# Patient Record
Sex: Male | Born: 1980 | Race: White | Hispanic: No | Marital: Single | Smoking: Never smoker
Health system: Southern US, Community
[De-identification: ages and names within clinical notes are randomized; demographics above are authoritative.]

## PROBLEM LIST (undated history)

## (undated) DIAGNOSIS — G4730 Sleep apnea, unspecified: Secondary | ICD-10-CM

## (undated) DIAGNOSIS — I370 Nonrheumatic pulmonary valve stenosis: Secondary | ICD-10-CM

## (undated) DIAGNOSIS — K469 Unspecified abdominal hernia without obstruction or gangrene: Secondary | ICD-10-CM

## (undated) DIAGNOSIS — N183 Chronic kidney disease, stage 3 unspecified: Secondary | ICD-10-CM

## (undated) DIAGNOSIS — J189 Pneumonia, unspecified organism: Secondary | ICD-10-CM

## (undated) DIAGNOSIS — I5032 Chronic diastolic (congestive) heart failure: Secondary | ICD-10-CM

## (undated) DIAGNOSIS — Q909 Down syndrome, unspecified: Secondary | ICD-10-CM

## (undated) DIAGNOSIS — Z9889 Other specified postprocedural states: Secondary | ICD-10-CM

## (undated) HISTORY — PX: CHEST TUBE INSERTION: SHX231

## (undated) HISTORY — DX: Chronic kidney disease, stage 3 (moderate): N18.3

## (undated) HISTORY — DX: Chronic kidney disease, stage 3 unspecified: N18.30

## (undated) HISTORY — DX: Morbid (severe) obesity due to excess calories: E66.01

## (undated) HISTORY — DX: Chronic diastolic (congestive) heart failure: I50.32

---
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
## 2009-11-15 ENCOUNTER — Emergency Department (HOSPITAL_COMMUNITY): Admission: EM | Admit: 2009-11-15 | Discharge: 2009-11-15 | Payer: Self-pay | Admitting: Emergency Medicine

## 2016-09-21 DIAGNOSIS — R55 Syncope and collapse: Secondary | ICD-10-CM | POA: Diagnosis not present

## 2016-09-21 DIAGNOSIS — J9601 Acute respiratory failure with hypoxia: Secondary | ICD-10-CM

## 2016-09-21 DIAGNOSIS — F79 Unspecified intellectual disabilities: Secondary | ICD-10-CM | POA: Diagnosis not present

## 2016-09-21 DIAGNOSIS — J189 Pneumonia, unspecified organism: Secondary | ICD-10-CM

## 2016-09-22 DIAGNOSIS — J189 Pneumonia, unspecified organism: Secondary | ICD-10-CM | POA: Diagnosis not present

## 2016-09-22 DIAGNOSIS — J9601 Acute respiratory failure with hypoxia: Secondary | ICD-10-CM | POA: Diagnosis not present

## 2016-09-22 DIAGNOSIS — F79 Unspecified intellectual disabilities: Secondary | ICD-10-CM | POA: Diagnosis not present

## 2016-09-22 DIAGNOSIS — R55 Syncope and collapse: Secondary | ICD-10-CM | POA: Diagnosis not present

## 2016-09-23 DIAGNOSIS — R55 Syncope and collapse: Secondary | ICD-10-CM | POA: Diagnosis not present

## 2016-09-23 DIAGNOSIS — J189 Pneumonia, unspecified organism: Secondary | ICD-10-CM | POA: Diagnosis not present

## 2016-09-23 DIAGNOSIS — F79 Unspecified intellectual disabilities: Secondary | ICD-10-CM | POA: Diagnosis not present

## 2016-09-23 DIAGNOSIS — J9601 Acute respiratory failure with hypoxia: Secondary | ICD-10-CM | POA: Diagnosis not present

## 2016-09-24 DIAGNOSIS — J9601 Acute respiratory failure with hypoxia: Secondary | ICD-10-CM | POA: Diagnosis not present

## 2016-09-24 DIAGNOSIS — J189 Pneumonia, unspecified organism: Secondary | ICD-10-CM | POA: Diagnosis not present

## 2016-09-24 DIAGNOSIS — R55 Syncope and collapse: Secondary | ICD-10-CM | POA: Diagnosis not present

## 2016-09-24 DIAGNOSIS — F79 Unspecified intellectual disabilities: Secondary | ICD-10-CM | POA: Diagnosis not present

## 2016-09-25 DIAGNOSIS — J9601 Acute respiratory failure with hypoxia: Secondary | ICD-10-CM | POA: Diagnosis not present

## 2016-09-25 DIAGNOSIS — R55 Syncope and collapse: Secondary | ICD-10-CM | POA: Diagnosis not present

## 2016-09-25 DIAGNOSIS — F79 Unspecified intellectual disabilities: Secondary | ICD-10-CM | POA: Diagnosis not present

## 2016-09-25 DIAGNOSIS — J189 Pneumonia, unspecified organism: Secondary | ICD-10-CM | POA: Diagnosis not present

## 2016-09-25 DIAGNOSIS — R7881 Bacteremia: Secondary | ICD-10-CM | POA: Diagnosis not present

## 2018-01-28 ENCOUNTER — Inpatient Hospital Stay (HOSPITAL_COMMUNITY)
Admission: AD | Admit: 2018-01-28 | Discharge: 2018-02-07 | DRG: 177 | Disposition: A | Discharge status: Home / Self Care | Payer: Medicare Other | Source: Other Acute Inpatient Hospital | Attending: Internal Medicine | Admitting: Internal Medicine

## 2018-01-28 ENCOUNTER — Other Ambulatory Visit: Payer: Self-pay

## 2018-01-28 ENCOUNTER — Encounter (HOSPITAL_COMMUNITY): Payer: Self-pay | Admitting: *Deleted

## 2018-01-28 DIAGNOSIS — J189 Pneumonia, unspecified organism: Secondary | ICD-10-CM | POA: Diagnosis present

## 2018-01-28 DIAGNOSIS — Z4682 Encounter for fitting and adjustment of non-vascular catheter: Secondary | ICD-10-CM

## 2018-01-28 DIAGNOSIS — D631 Anemia in chronic kidney disease: Secondary | ICD-10-CM | POA: Diagnosis present

## 2018-01-28 DIAGNOSIS — G4733 Obstructive sleep apnea (adult) (pediatric): Secondary | ICD-10-CM | POA: Diagnosis present

## 2018-01-28 DIAGNOSIS — I5030 Unspecified diastolic (congestive) heart failure: Secondary | ICD-10-CM | POA: Diagnosis not present

## 2018-01-28 DIAGNOSIS — Z9889 Other specified postprocedural states: Secondary | ICD-10-CM

## 2018-01-28 DIAGNOSIS — L03115 Cellulitis of right lower limb: Secondary | ICD-10-CM | POA: Diagnosis present

## 2018-01-28 DIAGNOSIS — Q909 Down syndrome, unspecified: Secondary | ICD-10-CM

## 2018-01-28 DIAGNOSIS — R0600 Dyspnea, unspecified: Secondary | ICD-10-CM

## 2018-01-28 DIAGNOSIS — N183 Chronic kidney disease, stage 3 unspecified: Secondary | ICD-10-CM

## 2018-01-28 DIAGNOSIS — Z6841 Body Mass Index (BMI) 40.0 and over, adult: Secondary | ICD-10-CM

## 2018-01-28 DIAGNOSIS — E8809 Other disorders of plasma-protein metabolism, not elsewhere classified: Secondary | ICD-10-CM | POA: Diagnosis not present

## 2018-01-28 DIAGNOSIS — J869 Pyothorax without fistula: Secondary | ICD-10-CM | POA: Diagnosis present

## 2018-01-28 DIAGNOSIS — J90 Pleural effusion, not elsewhere classified: Secondary | ICD-10-CM

## 2018-01-28 DIAGNOSIS — Z8709 Personal history of other diseases of the respiratory system: Secondary | ICD-10-CM

## 2018-01-28 DIAGNOSIS — I5033 Acute on chronic diastolic (congestive) heart failure: Secondary | ICD-10-CM | POA: Diagnosis present

## 2018-01-28 DIAGNOSIS — I5031 Acute diastolic (congestive) heart failure: Secondary | ICD-10-CM | POA: Diagnosis not present

## 2018-01-28 DIAGNOSIS — L03116 Cellulitis of left lower limb: Secondary | ICD-10-CM | POA: Diagnosis present

## 2018-01-28 DIAGNOSIS — R600 Localized edema: Secondary | ICD-10-CM | POA: Diagnosis not present

## 2018-01-28 DIAGNOSIS — I509 Heart failure, unspecified: Secondary | ICD-10-CM

## 2018-01-28 DIAGNOSIS — I370 Nonrheumatic pulmonary valve stenosis: Secondary | ICD-10-CM

## 2018-01-28 DIAGNOSIS — Z9689 Presence of other specified functional implants: Secondary | ICD-10-CM

## 2018-01-28 DIAGNOSIS — E876 Hypokalemia: Secondary | ICD-10-CM | POA: Diagnosis not present

## 2018-01-28 DIAGNOSIS — Z23 Encounter for immunization: Secondary | ICD-10-CM | POA: Diagnosis present

## 2018-01-28 DIAGNOSIS — R0602 Shortness of breath: Secondary | ICD-10-CM

## 2018-01-28 DIAGNOSIS — I5023 Acute on chronic systolic (congestive) heart failure: Secondary | ICD-10-CM | POA: Diagnosis not present

## 2018-01-28 DIAGNOSIS — R0609 Other forms of dyspnea: Secondary | ICD-10-CM | POA: Diagnosis not present

## 2018-01-28 DIAGNOSIS — D649 Anemia, unspecified: Secondary | ICD-10-CM | POA: Diagnosis not present

## 2018-01-28 HISTORY — DX: Sleep apnea, unspecified: G47.30

## 2018-01-28 HISTORY — DX: Other specified postprocedural states: Z98.890

## 2018-01-28 HISTORY — DX: Nonrheumatic pulmonary valve stenosis: I37.0

## 2018-01-28 HISTORY — DX: Down syndrome, unspecified: Q90.9

## 2018-01-28 HISTORY — DX: Pneumonia, unspecified organism: J18.9

## 2018-01-28 HISTORY — DX: Unspecified abdominal hernia without obstruction or gangrene: K46.9

## 2018-01-28 MED ORDER — INFLUENZA VAC SPLIT QUAD 0.5 ML IM SUSY
0.5000 mL | PREFILLED_SYRINGE | INTRAMUSCULAR | Status: AC
  Administered 2018-01-29: 0.5 mL via INTRAMUSCULAR

## 2018-01-29 ENCOUNTER — Encounter (HOSPITAL_COMMUNITY): Payer: Self-pay | Admitting: Cardiology

## 2018-01-29 ENCOUNTER — Other Ambulatory Visit (HOSPITAL_COMMUNITY): Payer: Medicare Other

## 2018-01-29 ENCOUNTER — Inpatient Hospital Stay (HOSPITAL_COMMUNITY): Payer: Medicare Other

## 2018-01-29 DIAGNOSIS — J869 Pyothorax without fistula: Principal | ICD-10-CM

## 2018-01-29 DIAGNOSIS — Z8709 Personal history of other diseases of the respiratory system: Secondary | ICD-10-CM

## 2018-01-29 DIAGNOSIS — Q909 Down syndrome, unspecified: Secondary | ICD-10-CM

## 2018-01-29 DIAGNOSIS — D649 Anemia, unspecified: Secondary | ICD-10-CM

## 2018-01-29 DIAGNOSIS — I5023 Acute on chronic systolic (congestive) heart failure: Secondary | ICD-10-CM

## 2018-01-29 DIAGNOSIS — L03116 Cellulitis of left lower limb: Secondary | ICD-10-CM

## 2018-01-29 DIAGNOSIS — R600 Localized edema: Secondary | ICD-10-CM

## 2018-01-29 DIAGNOSIS — I509 Heart failure, unspecified: Secondary | ICD-10-CM

## 2018-01-29 DIAGNOSIS — G4733 Obstructive sleep apnea (adult) (pediatric): Secondary | ICD-10-CM | POA: Diagnosis present

## 2018-01-29 LAB — COMPREHENSIVE METABOLIC PANEL
ALK PHOS: 135 U/L — AB (ref 38–126)
ALT: 12 U/L (ref 0–44)
ANION GAP: 11 (ref 5–15)
AST: 18 U/L (ref 15–41)
Albumin: 2.3 g/dL — ABNORMAL LOW (ref 3.5–5.0)
BUN: 12 mg/dL (ref 6–20)
CALCIUM: 7.9 mg/dL — AB (ref 8.9–10.3)
CO2: 32 mmol/L (ref 22–32)
Chloride: 96 mmol/L — ABNORMAL LOW (ref 98–111)
Creatinine, Ser: 1.13 mg/dL (ref 0.61–1.24)
GFR calc non Af Amer: >60 mL/min (ref >60)
Glucose, Bld: 146 mg/dL — ABNORMAL HIGH (ref 70–99)
POTASSIUM: 3.7 mmol/L (ref 3.5–5.1)
SODIUM: 139 mmol/L (ref 135–145)
TOTAL PROTEIN: 6.4 g/dL — AB (ref 6.5–8.1)
Total Bilirubin: 0.5 mg/dL (ref 0.3–1.2)

## 2018-01-29 LAB — URINALYSIS, ROUTINE W REFLEX MICROSCOPIC
Bilirubin Urine: NEGATIVE
Glucose, UA: NEGATIVE mg/dL
Hgb urine dipstick: NEGATIVE
Ketones, ur: NEGATIVE mg/dL
Leukocytes, UA: NEGATIVE
Nitrite: NEGATIVE
Protein, ur: NEGATIVE mg/dL
Specific Gravity, Urine: 1.02 (ref 1.005–1.030)
pH: 7 (ref 5.0–8.0)

## 2018-01-29 LAB — CBC
HEMATOCRIT: 36.2 % — AB (ref 39.0–52.0)
HEMOGLOBIN: 11 g/dL — AB (ref 13.0–17.0)
MCH: 29.3 pg (ref 26.0–34.0)
MCHC: 30.4 g/dL (ref 30.0–36.0)
MCV: 96.3 fL (ref 78.0–100.0)
Platelets: 369 10*3/uL (ref 150–400)
RBC: 3.76 MIL/uL — AB (ref 4.22–5.81)
RDW: 16.2 % — ABNORMAL HIGH (ref 11.5–15.5)
WBC: 6.2 10*3/uL (ref 4.0–10.5)

## 2018-01-29 LAB — BRAIN NATRIURETIC PEPTIDE: B NATRIURETIC PEPTIDE 5: 10.9 pg/mL (ref 0.0–100.0)

## 2018-01-29 LAB — SURGICAL PCR SCREEN
MRSA, PCR: NEGATIVE
Staphylococcus aureus: NEGATIVE

## 2018-01-29 LAB — TROPONIN I: Troponin I: <0.03 ng/mL (ref <0.03)

## 2018-01-29 LAB — HIV ANTIBODY (ROUTINE TESTING W REFLEX): HIV SCREEN 4TH GENERATION: NONREACTIVE

## 2018-01-29 LAB — MRSA PCR SCREENING: MRSA by PCR: POSITIVE — AB

## 2018-01-29 MED ORDER — SODIUM CHLORIDE 0.9 % IV SOLN
INTRAVENOUS | Status: DC | PRN
  Administered 2018-01-29: 10:00:00 via INTRAVENOUS

## 2018-01-29 MED ORDER — FUROSEMIDE 10 MG/ML IJ SOLN
40.0000 mg | Freq: Two times a day (BID) | INTRAMUSCULAR | Status: DC
  Administered 2018-01-29 – 2018-01-30 (×3): 40 mg via INTRAVENOUS
  Filled 2018-01-29 (×3): qty 4

## 2018-01-29 MED ORDER — CHLORHEXIDINE GLUCONATE CLOTH 2 % EX PADS
6.0000 | MEDICATED_PAD | Freq: Every day | CUTANEOUS | Status: AC
  Administered 2018-01-29 – 2018-02-02 (×4): 6 via TOPICAL

## 2018-01-29 MED ORDER — ACETAMINOPHEN 650 MG RE SUPP: 650.0000 mg | Freq: Four times a day (QID) | RECTAL | Status: DC | PRN

## 2018-01-29 MED ORDER — PIPERACILLIN-TAZOBACTAM 3.375 G IVPB
3.3750 g | Freq: Three times a day (TID) | INTRAVENOUS | Status: DC
  Administered 2018-01-29 – 2018-02-07 (×27): 3.375 g via INTRAVENOUS
  Filled 2018-01-29 (×29): qty 50

## 2018-01-29 MED ORDER — PIPERACILLIN-TAZOBACTAM 3.375 G IVPB 30 MIN
3.3750 g | Freq: Once | INTRAVENOUS | Status: AC
  Administered 2018-01-29: 3.375 g via INTRAVENOUS
  Filled 2018-01-29 (×2): qty 50

## 2018-01-29 MED ORDER — ONDANSETRON HCL 4 MG/2ML IJ SOLN: 4.0000 mg | Freq: Four times a day (QID) | INTRAMUSCULAR | Status: DC | PRN

## 2018-01-29 MED ORDER — ACETAMINOPHEN 325 MG PO TABS: 650.0000 mg | ORAL_TABLET | Freq: Four times a day (QID) | ORAL | Status: DC | PRN

## 2018-01-29 MED ORDER — MUPIROCIN 2 % EX OINT
1.0000 "application " | TOPICAL_OINTMENT | Freq: Two times a day (BID) | CUTANEOUS | Status: AC
  Administered 2018-01-29 – 2018-02-02 (×10): 1 via NASAL
  Filled 2018-01-29 (×2): qty 22

## 2018-01-29 MED ORDER — ONDANSETRON HCL 4 MG PO TABS: 4.0000 mg | ORAL_TABLET | Freq: Four times a day (QID) | ORAL | Status: DC | PRN

## 2018-01-29 NOTE — Progress Notes (Signed)
MD called, patient can have heart healthy diet.

## 2018-01-29 NOTE — Progress Notes (Signed)
Pharmacy Antibiotic Note  Joshua Reilly is a 37 y.o. male admitted on 01/28/2018 with empyema.  Pharmacy has been consulted for Zosyn dosing.  Plan: Zosyn 3.375g IV q8h (4-hour infusion).  Height: 5\' 6"  (167.6 cm) Weight: (!) 361 lb 6.4 oz (163.9 kg) IBW/kg (Calculated) : 63.8  Temp (24hrs), Avg:98.7 F (37.1 C), Min:98.5 F (36.9 C), Max:98.9 F (37.2 C)   No Known Allergies   Thank you for allowing pharmacy to be a part of this patient's care.  Vernard Gambles, PharmD, BCPS  01/29/2018 2:17 AM

## 2018-01-29 NOTE — Progress Notes (Signed)
Informed MD of MRSA PCR positive and primary staff aware

## 2018-01-29 NOTE — Progress Notes (Signed)
PROGRESS NOTE   Olav Zawada  ZZC:[id number]    DOB: 1980/11/03    DOA: 01/28/2018  PCP: Shelbie Ammons, MD   I have briefly reviewed patients previous medical records in [facility].  Brief Narrative:  37 year old male, resident of an ALF, PMH of Down syndrome, pulmonary stenosis, chronic CHF, OSA awaiting CPAP, not on home oxygen, abdominal hernia, as per history provided by father, patient was hospitalized sometime in 2018 and had 2 chest tubes placed on the right side which drained thick fluid, underwent left-sided thoracentesis in June at [facility], now transferred from [facility] on 9/10 with a couple of weeks history of progressive lower extremity edema and dyspnea on mild exertion.  He has had low-grade fevers but no cough.  Evaluation at [facility] showed CTA negative for PE, loculated left pleural effusion concerning for empyema.  As per family, lower extremity Dopplers a week ago at ALF reportedly negative for DVT.  Lasix dose recently increased as outpatient.  Patient sees pulmonologist Dr. Blenda Nicely who evaluated him at [facility] ED and recommended admission to a tertiary cancer center for evaluation by CTS surgery, consideration for VATS.  Patient was thereby transferred to [facility].   Assessment & Plan:   Principal Problem:   Empyema (HCC) Active Problems:   CHF (congestive heart failure) (HCC)   OSA (obstructive sleep apnea)   Loculated left pleural effusion: Concern for empyema.  Patient has prior history of what appears to be right sided empyema and recurrent left-sided pleural effusions requiring intervention.  I consulted CTS who advised that patient is going to have IR place pigtail catheter or possible surgery on 02/01/2018.  Continue empirically started IV vancomycin.  Acute on chronic CHF/pulmonary stenosis: Unclear type.  No echo in CHL.  Check TTE.  Requested medical records from [facility] and ALF.  Cardiology consulted.  Currently on IV Lasix 40  mg twice daily.  -1.6 L since admission.  Anasarca: May be multifactorial related to decompensated CHF, hypoalbuminemia and possible right heart failure.  Diuresis as above.  Dietitian consultation.  OSA: Apparently recently diagnosed and awaiting CPAP.  Follows with pulmonology in [place].  Morbid obesity/Body mass index is 41.87 kg/m.  Normocytic anemia: Possibly chronic.  Follow CBCs.  Down syndrome: Mental status at baseline.   DVT prophylaxis: SCDs Code Status: Full Family Communication: Discussed in detail with patient's father at bedside, updated care and answered questions.   Disposition: To be determined pending improvement.   Consultants:  Cardiothoracic surgery Cardiology  Procedures:  None  Antimicrobials:  IV Zosyn   Subjective: Patient interviewed and examined with father at bedside.  Patient denies complaints.  Denies dyspnea, chest pain.  Wants to eat (was NPO) both procedures.  As per father, more than 2 weeks history of progressively worsening lower extremity swelling, dyspnea on mild exertion but no significant cough or fevers.  No chest pain reported.  ROS: As above, otherwise negative.  Objective:  Vitals:   01/29/18 0041 01/29/18 0635 01/29/18 0800 01/29/18 1233  BP: (!) 118/50 (!) 108/58 (!) 95/48 (!) 110/51  Pulse: 90 89 88 85  Resp: 18 18 18    Temp: 98.9 F (37.2 C) 98.7 F (37.1 C) 98.5 F (36.9 C) 99 F (37.2 C)  TempSrc: Oral Oral Oral Oral  SpO2: 97% 93% 91% 91%  Weight:  117.7 kg    Height:        Examination:  General exam: Pleasant young male, moderately built and morbidly obese, lying comfortably propped  up in bed.  Down's facies Respiratory system: Reduced breath sounds in the bases, left> right with occasional basal crackles.  Rest of lung fields clear to auscultation without wheezing, rhonchi. Respiratory effort normal. Cardiovascular system: S1 & S2 heard, RRR. No JVD, rubs, gallops or clicks.?  2 x 6 systolic ejection  murmur best heard at left lower sternal edge.  1+ pitting bilateral leg edema,?  Lymphedema. Gastrointestinal system: Abdomen is nondistended, soft and nontender. No organomegaly or masses felt. Normal bowel sounds heard.  Anterior abdominal wall hernia, uncomplicated. Central nervous system: Alert and oriented x2. No focal neurological deficits. Extremities: Symmetric 5 x 5 power. Skin: No rashes, lesions or ulcers Psychiatry: Judgement and insight impaired. Mood & affect appropriate.     Data Reviewed: I have personally reviewed following labs and imaging studies  CBC: Recent Labs  Lab 01/29/18 0814  WBC 6.2  HGB 11.0*  HCT 36.2*  MCV 96.3  PLT 369   Basic Metabolic Panel: Recent Labs  Lab 01/29/18 0814  NA 139  K 3.7  CL 96*  CO2 32  GLUCOSE 146*  BUN 12  CREATININE 1.13  CALCIUM 7.9*   Liver Function Tests: Recent Labs  Lab 01/29/18 0814  AST 18  ALT 12  ALKPHOS 135*  BILITOT 0.5  PROT 6.4*  ALBUMIN 2.3*   Cardiac Enzymes: Recent Labs  Lab 01/29/18 0814 01/29/18 1029  TROPONINI <0.03 <0.03     Recent Results (from the past 240 hour(s))  MRSA PCR Screening     Status: Abnormal   Collection Time: 01/29/18  2:24 AM  Result Value Ref Range Status   MRSA by PCR POSITIVE (A) NEGATIVE Final    Comment:        The GeneXpert MRSA Assay (FDA approved for NASAL specimens only), is one component of a comprehensive MRSA colonization surveillance program. It is not intended to diagnose MRSA infection nor to guide or monitor treatment for MRSA infections. RESULT CALLED TO, READ BACK BY AND VERIFIED WITHLeta Baptist RN [phone] 01/29/18 A BROWNING Performed at [facility], [address], [place], [place] [address]          Radiology Studies: Dg Chest 2 View  Result Date: 01/29/2018 CLINICAL DATA:  Shortness of breath EXAM: CHEST - 2 VIEW COMPARISON:  CT chest of 01/28/2018 and chest x-ray of the same day FINDINGS: There is persistent opacity at  the left lung base which compared to the CT appears to correlate with atelectasis and possible pneumonia as well as left pleural effusion. Elevation of left hemidiaphragm again is noted. The right lung is relatively clear. Mild cardiomegaly is stable. No bony abnormality is seen. IMPRESSION: Persistent opacity at the left lung base consistent with left pleural effusion, as well as left basilar atelectasis and possible pneumonia. Electronically Signed   By: Dwyane Dee M.D.   On: 01/29/2018 09:21        Scheduled Meds: . Chlorhexidine Gluconate Cloth  6 each Topical Q0600  . furosemide  40 mg Intravenous BID  . mupirocin ointment  1 application Nasal BID   Continuous Infusions: . sodium chloride 10 mL/hr at 01/29/18 1007  . piperacillin-tazobactam (ZOSYN)  IV 3.375 g (01/29/18 1017)     LOS: 1 day     Marcellus Scott, MD, FACP, [facility]. [facility] Pager [phone]  If 7PM-7AM, please contact night-coverage [email or web address] Password [email or web address] 01/29/2018, 3:11 PM

## 2018-01-29 NOTE — Progress Notes (Signed)
[facility] admissions paged for admission orders.

## 2018-01-29 NOTE — Plan of Care (Signed)
  Problem: Education: Goal: Knowledge of General Education information will improve Description Including pain rating scale, medication(s)/side effects and non-pharmacologic comfort measures Outcome: Progressing   Problem: Health Behavior/Discharge Planning: Goal: Ability to manage health-related needs will improve Outcome: Progressing   

## 2018-01-29 NOTE — Clinical Social Work Note (Signed)
Clinical Social Work Assessment  Patient Details  Name: Joshua Reilly MRN: [id number] Date of Birth: 01-26-1981  Date of referral:  01/29/18               Reason for consult:  Discharge Planning                Permission sought to share information with:  Facility Sport and exercise psychologist, Family Supports Permission granted to share information::  Yes, Verbal Permission Granted  Name::     Josten Warmuth  Agency::  [facility]  Relationship::  Father  Contact Information:  [phone]  Housing/Transportation Living arrangements for the past 2 months:  [place] of Information:  Patient, Medical Team, Partner Patient Interpreter Needed:  None Criminal Activity/Legal Involvement Pertinent to Current Situation/Hospitalization:  No - Comment as needed Significant Relationships:  Parents, Other Family Members Lives with:  Facility Resident Do you feel safe going back to the place where you live?  Yes Need for family participation in patient care:  Yes (Comment)  Care giving concerns:  Patient is a resident at [facility]F.   Social Worker assessment / plan:  CSW met with patient. Father at bedside. CSW introduced role and explained that discharge planning would be discussed. Patient and his father confirmed he is from [place] and plan to return at discharge. Patient's father will likely transport him back to the facility. No further concerns. CSW encouraged patient and his father to contact CSW as needed. CSW will continue to follow patient and his father for support and facilitate discharge back to ALF once medically stable.  Employment status:  Unemployed Forensic scientist:  Medicare PT Recommendations:  Not assessed at this time Information / Referral to community resources:  Other (Comment Required)(Plan is to return to ALF)  Patient/Family's Response to care:  Patient and his father agreeable to return to ALF. Patient's father  supportive and involved in patient's care. Patient and his father appreciated social work intervention.  Patient/Family's Understanding of and Emotional Response to Diagnosis, Current Treatment, and Prognosis:  Patient and his father have a good understanding of the reason for admission and plan to return to ALF at discharge. Patient and his father appear happy with hospital care.  Emotional Assessment Appearance:  Appears stated age Attitude/Demeanor/Rapport:  Engaged Affect (typically observed):  Accepting, Appropriate, Calm, Pleasant Orientation:  Oriented to Self, Oriented to Place, Oriented to  Time, Oriented to Situation Alcohol / Substance use:  Never Used Psych involvement (Current and /or in the community):  No (Comment)  Discharge Needs  Concerns to be addressed:  Care Coordination Readmission within the last 30 days:  No Current discharge risk:  None Barriers to Discharge:  Continued Medical Work up   Candie Chroman, LCSW 01/29/2018, 12:29 PM

## 2018-01-29 NOTE — Consult Note (Addendum)
Cardiology Consultation:   Patient ID: Joshua Reilly MRN: [id number]; DOB: 04/17/1981  Admit date: 01/28/2018 Date of Consult: 01/29/2018  Primary Care Provider: Shelbie Ammons, MD Primary Cardiologist: Tobias Alexander, MD NEW Primary Electrophysiologist:  None    Patient Profile:   Joshua Reilly is a 37 y.o. male with a hx of Down syndrome, OSA, morbid obesity and admitted early AM today for SOB, who is being seen today for the evaluation of acute on chronic CHF and pulmonary stenosis at the request of Dr. Waymon Amato.  History of Present Illness:   Joshua Reilly with above hx. And now admitted from SNF for increasing SOB over last 2 weeks.  Also with bilateral lower ext edema Lt > rt , temp of 99.7, + cough, no chest pain and venous doppler last week neg for DVT.  Lasix had been increased for last week.    Pt had pl effusions in June and was seen at [facility] with thoracentesis done twice.  ( 2 yrs ago he was in ICU and hd 2 chest tubes) Recent new OSA dx.  He has not seen cardiology in several years due to his cardiologist retired from [place].    Pt was originally seen at [facility] with CXR mild vascular congestion, CTA chest neg for PE, and increase in size of mod lt pl effusion.  Now with loculated subpulmonic components with thick ring enhancing was suspicious for empyema.    Pt transferred to [facility].   CXR today :  Persistent opacity at the left lung base consistent with left pleural effusion, as well as left basilar atelectasis and possible pneumonia.  Pt has been seen by Dr. Maren Beach for possible VATS, (pt seen by Dr Blenda Nicely pulmonologist at [facility] and recommended transfer)    Echo pending  EKG:  The EKG was personally reviewed and demonstrates:  SR with QTC 490 Telemetry:  Telemetry was personally reviewed and demonstrates:  SR  Troponin <0.03 X 2, BNP 10.9  Na 139, K+ 3.7, Ca+ 7.9, Cr 1.13 albumin 2.3,  Hgb 11, plts 369   Currently resting in bed with no chest pain or SOB.  He has not  had chest pain.  His dad tells me if he were to walk form bed to door he would have to stop and catch his breath.   No syncope, no palpitations.  Continued edema of his legs despite lasix, his dad is worried about this because not responding.    Past Medical History:  Diagnosis Date  . Abdominal hernia   . Abdominal hernia    "not repaired yet" (01/28/2018)  . CHF (congestive heart failure) (HCC)   . Down's syndrome   . Heart murmur    "born w/it" (01/28/2018)  . Pneumonia    "4-5 times" (01/28/2018)  . Pulmonary stenosis   . S/P thoracentesis 10/2017 X 2  . Sleep apnea    "waiting on machine" (01/28/2018)    Past Surgical History:  Procedure Laterality Date  . CHEST TUBE INSERTION Right ~ 2016  . CLUB FOOT RELEASE Bilateral 1980   "heel cords tightened"     Home Medications:  Prior to Admission medications   Medication Sig Start Date End Date Taking? Authorizing Provider  amLODipine (NORVASC) 5 MG tablet Take 10 mg by mouth daily. 01/14/18  Yes [provider]  aspirin 325 MG tablet Take 325 mg by mouth daily.   Yes [provider]  cephALEXin (KEFLEX) 500 MG capsule Take 500 mg by mouth 3 (three) times daily.  01/18/18  Yes [provider]  furosemide (LASIX) 40 MG tablet Take 40 mg by mouth daily. 01/23/18  Yes [provider]  lamoTRIgine (LAMICTAL) 100 MG tablet Take 100 mg by mouth at bedtime. 01/14/18  Yes [provider]  levothyroxine (SYNTHROID, LEVOTHROID) 75 MCG tablet Take 75 mcg by mouth daily. 01/14/18  Yes [provider]  LORazepam (ATIVAN) 0.5 MG tablet Take 0.5 mg by mouth every 8 (eight) hours as needed for anxiety.    Yes [provider]  Omega-3 Fatty Acids (FISH OIL) 1000 MG CAPS Take 1,000 mg by mouth daily.   Yes [provider]  potassium chloride (K-DUR,KLOR-CON) 10 MEQ tablet Take 10 mEq by mouth daily. 01/14/18  Yes [provider]  rivastigmine (EXELON) 4.5 MG capsule Take 4.5 mg by  mouth at bedtime. 01/14/18  Yes [provider]  venlafaxine XR (EFFEXOR-XR) 75 MG 24 hr capsule Take 75 mg by mouth daily. 01/14/18  Yes [provider]    Inpatient Medications: Scheduled Meds: . Chlorhexidine Gluconate Cloth  6 each Topical Q0600  . furosemide  40 mg Intravenous BID  . mupirocin ointment  1 application Nasal BID   Continuous Infusions: . sodium chloride Stopped (01/29/18 1612)  . piperacillin-tazobactam (ZOSYN)  IV Stopped (01/29/18 1604)   PRN Meds: sodium chloride, acetaminophen **OR** acetaminophen, ondansetron **OR** ondansetron (ZOFRAN) IV  Allergies:   No Known Allergies  Social History:   Social History   Socioeconomic History  . Marital status: Single    Spouse name: Not on file  . Number of children: Not on file  . Years of education: Not on file  . Highest education level: Not on file  Occupational History  . Not on file  Social Needs  . Financial resource strain: Not on file  . Food insecurity:    Worry: Not on file    Inability: Not on file  . Transportation needs:    Medical: Not on file    Non-medical: Not on file  Tobacco Use  . Smoking status: Never Smoker  . Smokeless tobacco: Never Used  Substance and Sexual Activity  . Alcohol use: Never    Frequency: Never  . Drug use: Never  . Sexual activity: Never  Lifestyle  . Physical activity:    Days per week: Not on file    Minutes per session: Not on file  . Stress: Not on file  Relationships  . Social connections:    Talks on phone: Not on file    Gets together: Not on file    Attends religious service: Not on file    Active member of club or organization: Not on file    Attends meetings of clubs or organizations: Not on file    Relationship status: Not on file  . Intimate partner violence:    Fear of current or ex partner: Not on file    Emotionally abused: Not on file    Physically abused: Not on file    Forced sexual activity: Not on file  Other Topics  Concern  . Not on file  Social History Narrative  . Not on file    Family History:    Family History  Adopted: Yes     ROS:  Please see the history of present illness.  General:no colds or fevers, no weight changes Skin:no rashes or ulcers HEENT:no blurred vision, no congestion CV:see HPI PUL:see HPI GI:no diarrhea constipation or melena, no indigestion GU:no hematuria, no dysuria MS:no joint  pain, no claudication Neuro:no syncope, no lightheadedness, Down's syndrome Endo:no diabetes, no thyroid disease  All other ROS reviewed and negative.     Physical Exam/Data:   Vitals:   01/29/18 0635 01/29/18 0800 01/29/18 1233 01/29/18 1620  BP: (!) 108/58 (!) 95/48 (!) 110/51 132/65  Pulse: 89 88 85 91  Resp: 18 18  20   Temp: 98.7 F (37.1 C) 98.5 F (36.9 C) 99 F (37.2 C) 99.2 F (37.3 C)  TempSrc: Oral Oral Oral Oral  SpO2: 93% 91% 91% 97%  Weight: 117.7 kg     Height:        Intake/Output Summary (Last 24 hours) at 01/29/2018 1736 Last data filed at 01/29/2018 1622 Gross per 24 hour  Intake 425.72 ml  Output 1900 ml  Net -1474.28 ml   Filed Weights   01/28/18 2153 01/29/18 0635  Weight: (!) 163.9 kg 117.7 kg   Body mass index is 41.87 kg/m.  General:  Well nourished, well developed, in no acute distress lying almost flat in bed  HEENT: normal Lymph: no adenopathy Neck: no JVD Endocrine:  No thryomegaly Vascular: No carotid bruits; pedal pulses 2+ bilaterally  Cardiac:  normal S1, S2; RRR; no murmur, gallup rub or click but muffled with lung sounds  Lungs:  Rhonchi to auscultation bilaterally, no wheezing, rhonchi or rales  Abd: obese, soft, nontender, no hepatomegaly  Ext: 2+ lower ext edema Musculoskeletal:  No deformities, BUE and BLE strength normal and equal, lower ext with redness and warm to touch Skin: warm and dry  Neuro:  CNs 2-12 intact, no focal abnormalities noted Psych:  Normal affect    Relevant CV Studies: None from [place] have been  requested.   Laboratory Data:  Chemistry Recent Labs  Lab 01/29/18 0814  NA 139  K 3.7  CL 96*  CO2 32  GLUCOSE 146*  BUN 12  CREATININE 1.13  CALCIUM 7.9*  GFRNONAA >60  GFRAA >60  ANIONGAP 11    Recent Labs  Lab 01/29/18 0814  PROT 6.4*  ALBUMIN 2.3*  AST 18  ALT 12  ALKPHOS 135*  BILITOT 0.5   Hematology Recent Labs  Lab 01/29/18 0814  WBC 6.2  RBC 3.76*  HGB 11.0*  HCT 36.2*  MCV 96.3  MCH 29.3  MCHC 30.4  RDW 16.2*  PLT 369   Cardiac Enzymes Recent Labs  Lab 01/29/18 0814 01/29/18 1029  TROPONINI <0.03 <0.03   No results for input(s): TROPIPOC in the last 168 hours.  BNP Recent Labs  Lab 01/29/18 0814  BNP 10.9    DDimer No results for input(s): DDIMER in the last 168 hours.  Radiology/Studies:  Dg Chest 2 View  Result Date: 01/29/2018 CLINICAL DATA:  Shortness of breath EXAM: CHEST - 2 VIEW COMPARISON:  CT chest of 01/28/2018 and chest x-ray of the same day FINDINGS: There is persistent opacity at the left lung base which compared to the CT appears to correlate with atelectasis and possible pneumonia as well as left pleural effusion. Elevation of left hemidiaphragm again is noted. The right lung is relatively clear. Mild cardiomegaly is stable. No bony abnormality is seen. IMPRESSION: Persistent opacity at the left lung base consistent with left pleural effusion, as well as left basilar atelectasis and possible pneumonia. Electronically Signed   By: Dwyane Dee M.D.   On: 01/29/2018 09:21    Assessment and Plan:   1. Acute on chronic CHF/pulmonary stenosis on lasix 40 IV BID  Neg 1474 since admit.  Awaiting Echo. + DOE none in bed. Edema has not improved   Dr. Delton See to see.  2. Loculated Lt pl effusion possible empyema.  Dr. [name] has seen possible VATS  3. Anasarca.  Hypoalbuminemia - diuresing.  4. OSA recent dx has not yet re'd CPAP  5. Morbid obesity. 6. Down's syndrome at baseline.   For questions or updates, please contact  [facility] Please consult [email or web address] for contact info under   Signed, Nada Boozer, NP  01/29/2018 5:36 PM  The patient was seen, examined and discussed with Nada Boozer, NP and I agree with the above.   Joshua Reilly is a very pleasant young gentleman with h/o Down Syndrome, who was adopted at age of 8 months. As a child he was diagnosed with pulmonary stenosis that didn't cause any symptoms, he was very active enrolled in multiple sports. With age he gained weight, became less active, currently lives in assisted living facility. He underwent left pleural effusion thoracentesis in June - no further details on fluid studies. He developed DOE, LE edema about 5 days ago, presented to [facility] - with negative venous US for DVT. Chest CT negative for pulmonary embolism, and showed moderate left pleural effusion suspicious for empyema. Smaller when compared to the prior in June. The patient has had fever/chills few days ago, and productive cough.  On physical exam he is sleeping comfortably in horizontal position, JVD unable to assess, Decreased BS in the left base, 2+ LE edema with increased erythema and warmth. BNP 10, Crea 1.1, Hb 11.0, troponin negative ECG shows SR, normal ECG  A/P Propable underlying pneumonia with empyema, pulmonary should be consulted for thoracentesis/ VATS Cellulitis Pulmonary stenosis  Treatment for pneumonia and cellulitis by primary team. Continue Lasix 40 mg iv BID. We will order echocardiogram, systolic murmur is not loud. CHF rather low on differential. We will follow.  Tobias Alexander, MD 01/29/2018

## 2018-01-29 NOTE — H&P (Signed)
History and Physical    Joshua Reilly ZTI:[id number] DOB: 1980/11/08 DOA: 01/28/2018  PCP: Joshua Ammons, MD   Patient [facility] from: [facility] in [facility] [place]  Chief Complaint: SOB  HPI: Joshua Reilly is a 37 y.o. male with medical history significant Down syndrome, OSA, morbid obesity, presented to the ED with complaints of sense of breath with exertion over the past 2 weeks, also bilateral lower extremity swelling left greater than right of 2 weeks duration.  Patient's father is present at bedside and helps with the history.  Father reports a fever- but temperature 99.7.  With cough.  No chest pain.  Bilateral lower extremity swelling without pain.  Father reports patient had lower extremity Dopplers a week ago that was negative for DVT, at the nursing home.  Patient Lasix dose recently increased within the past week to 40 twice daily.  Patient was admitted 10/2017 at [facility], and had thoracocentesis done twice.  Father is is unaware, if etiology of pleural effusion was deemed infectious.  Also ICU admission at [place]~2 years ago, requiring 2 chest tubes. Patient has a pulmonologist Dr. Esther Reilly, he sees for new OSA diagnosis.  In the ED at [facility]-chest x-ray revealed mild vascular congestion, subsequent CTA-negative for PE, but showed interval increase in size of now moderate left pleural effusion, there is new loculated subpulmonic components with thick, ring enhancing wall, suspicious for empyema.  Slightly improved aeration of lingula and left lower lobe.  WBC 5.9.   Mildly elevated ALP 175.  proBNP WNL  73.  Tropes x2.  Patient's pulmonologist evaluated patient in the ED and recommended admission to tertiary care center for valuation by CTS surgery, consideration for possible VATS. Hospitalist was called to transfer patient for empyema.  Review of Systems: As per HPI all other systems reviewed and negative  Past Medical History:  Diagnosis Date  .  Abdominal hernia   . Abdominal hernia    "not repaired yet" (01/28/2018)  . CHF (congestive heart failure) (HCC)   . Down's syndrome   . Heart murmur    "born w/it" (01/28/2018)  . Pneumonia    "4-5 times" (01/28/2018)  . Pulmonary stenosis   . S/P thoracentesis 10/2017 X 2  . Sleep apnea    "waiting on machine" (01/28/2018)    Past Surgical History:  Procedure Laterality Date  . CHEST TUBE INSERTION Right ~ 2016  . CLUB FOOT RELEASE Bilateral 1980   "heel cords tightened"     reports that he has never smoked. He has never used smokeless tobacco. He reports that he does not drink alcohol or use drugs.  No Known Allergies  Family history-Father has OSA.  Prior to Admission medications   Not on File    Physical Exam: Vitals:   01/28/18 2150 01/28/18 2153  BP: (!) 106/57   Pulse: 84   Resp: 18   Temp: 98.5 F (36.9 C)   TempSrc: Oral   SpO2: 99%   Weight:  (!) 163.9 kg  Height:  5\' 6"  (1.676 m)    Constitutional: NAD, calm, comfortable, morbidly obese Vitals:   01/28/18 2150 01/28/18 2153  BP: (!) 106/57   Pulse: 84   Resp: 18   Temp: 98.5 F (36.9 C)   TempSrc: Oral   SpO2: 99%   Weight:  (!) 163.9 kg  Height:  5\' 6"  (1.676 m)   Eyes: PERRL, lids and conjunctivae normal ENMT: Mucous membranes are moist. Posterior pharynx clear of any exudate or  lesions..  Neck: normal, supple, no masses, no thyromegaly Respiratory: Reduced breath sounds lung bases, ?2/2 body habitus, no wheezing, no crackles. Normal respiratory effort. No accessory muscle use.  Cardiovascular: Regular rate and rhythm, no murmurs / rubs / gallops. 3+ pitting pedal edema to knees , with mild erythema to left leg, without tenderness Abdomen: obese, no tenderness, no masses palpated. No hepatosplenomegaly. Bowel sounds positive.  Musculoskeletal: no clubbing / cyanosis. No joint deformity upper and lower extremities. Good ROM, no contractures. Normal muscle tone.  Skin: no rashes, lesions, ulcers.  No induration Neurologic: CN 2-12 grossly intact.  Strength 5/5 in all 4.  Psychiatric: Mentally challenged with downs, Alert and oriented x 3. Normal mood.   Assessment/Plan Active Problems:   CHF (congestive heart failure) (HCC)   OSA (obstructive sleep apnea)   Empyema (HCC)   Empyema-adjusted on CTA at [facility].  Stable vitals.  Afebrile. WBC 5.9.  Zosyn x1 given at [facility] -Continue IV antibiotics Zosyn per pharmacy. Resident of nursing home. -Pulmonology, cardio thoracic surgery consult in a.m. -BMP CBC repeat -Obtain records of lower extremity ultrasound from [place] - NPO pending CTS and pulm eval -Two-view chest x-ray  Fluid overload-bilateral lower extremity swelling, [facility]BNP at [facility]- 73 WNL. Recent increased lasix to 40mg  BID. - BNP, likely WNL, considering morbid obesity -EKG -Tropes x3 -Echocardiogram -Cont IV Lasix at 40mg  BID -Daily weights input output strict  - CMP, CBC, check albumin  Morbid obesity, OSA -Further request holding off on CPAP for now, as his OSA diagnosis is new, and he has not had CPAP titration. Follows with pulmonology.  HIV as part of routine health screening   DVT prophylaxis: scds Code Status: full Family Communication: Father at bedside Disposition Plan: Per rounding team Consults called: Please pulmonology, cardiothoracic surgery consultation in a.m. Admission status: inpt, tele   Joshua Boer MD [facility] Pager [phone] From 6PM-2AM.  Otherwise please contact night-coverage [email or web address] Password [email or web address]  01/29/2018, 12:09 AM

## 2018-01-29 NOTE — Consult Note (Addendum)
[address]       [place] [address]             [phone]        Joshua Reilly [facility] Medical Record #[id number] Date of Birth: [birth year]  Referring: [place] Primary Care: Shelbie Ammons, MD Primary Cardiologist:No primary care provider on file.  Chief Complaint:Empyema  History of Present Illness:      Mr. Mcbrien is a 37 yo morbidly obese white male with known history of Down Syndrome, Pulmonary Stenosis, CHF, and OSA.  He presented to the ED with complaints of shortness of breath and worsening lower extremity edema.  This has been occurring over the past 2 weeks.  His father is at bedside and states he has had one episode of a low grade fever.  He has had multiple instances of pleural effusion over the past several years.  He states approximately 3 years ago he was admitted for approximately 25 days during which 2 chest tubes were placed to drain "thick fluid."  He was most recently admitted in June at [facility] and underwent Thoracentesis with successful removal of fluid.  In regards to his Pulmonary stenosis, he states that they have not seen a Cardiology in several years as his retired from [facility].  He states there is a doctor at the assisted living facility who cares for the residents.  Workup in [place] showed CT scan negative for PE, but there was an increase in a moderate left pleural effusion.  There was a new area of loculation with subpulmonic components with thick, ring enhancing wall which was felt to be suspicious for empyema.  There was no leukocytosis as his WBC was 5.9.  He was transferred to [facility] for further care.  Currently, the patient denies pain and shortness of breath.  The father states he does okay when he is in bed.  However, if he gets up and walks he is unable to go very far without having to stop and rest.  He denies history of smoking.   Current Activity/ Functional Status: Patient is not independent with  mobility/ambulation, transfers, ADL's, IADL's.   Zubrod Score: At the time of surgery this patient's most appropriate activity status/level should be described as: []     0    Normal activity, no symptoms []     1    Restricted in physical strenuous activity but ambulatory, able to do out light work []     2    Ambulatory and capable of self care, unable to do work activities, up and about                 more than 50%  Of the time                            []     3    Only limited self care, in bed greater than 50% of waking hours []     4    Completely disabled, no self care, confined to bed or chair []     5    Moribund  Past Medical History:  Diagnosis Date  . Abdominal hernia   . Abdominal hernia    "not repaired yet" (01/28/2018)  . CHF (congestive heart failure) (HCC)   . Down's syndrome   . Heart murmur    "born w/it" (01/28/2018)  . Pneumonia    "4-5 times" (01/28/2018)  .  Pulmonary stenosis   . S/P thoracentesis 10/2017 X 2  . Sleep apnea    "waiting on machine" (01/28/2018)    Past Surgical History:  Procedure Laterality Date  . CHEST TUBE INSERTION Right ~ 2016  . CLUB FOOT RELEASE Bilateral 1980   "heel cords tightened"    Social History   Tobacco Use  Smoking Status Never Smoker  Smokeless Tobacco Never Used    Social History   Substance and Sexual Activity  Alcohol Use Never  . Frequency: Never     No Known Allergies  Current Facility-Administered Medications  Medication Dose Route Frequency Provider Last Rate Last Dose  . 0.9 %  sodium chloride infusion   Intravenous PRN Elease Etienne, MD 10 mL/hr at 01/29/18 1007    . acetaminophen (TYLENOL) tablet 650 mg  650 mg Oral Q6H PRN Emokpae, Ejiroghene E, MD       Or  . acetaminophen (TYLENOL) suppository 650 mg  650 mg Rectal Q6H PRN Emokpae, Ejiroghene E, MD      . Chlorhexidine Gluconate Cloth 2 % PADS 6 each  6 each Topical Q0600 Hongalgi, Anand D, MD      . furosemide (LASIX) injection 40 mg  40 mg  Intravenous BID Emokpae, Ejiroghene E, MD   40 mg at 01/29/18 0937  . mupirocin ointment (BACTROBAN) 2 % 1 application  1 application Nasal BID Elease Etienne, MD   1 application at 01/29/18 1157  . ondansetron (ZOFRAN) tablet 4 mg  4 mg Oral Q6H PRN Emokpae, Ejiroghene E, MD       Or  . ondansetron (ZOFRAN) injection 4 mg  4 mg Intravenous Q6H PRN Emokpae, Ejiroghene E, MD      . piperacillin-tazobactam (ZOSYN) IVPB 3.375 g  3.375 g Intravenous Q8H Bryk, Veronda P, RPH 12.5 mL/hr at 01/29/18 1017 3.375 g at 01/29/18 1017    Medications Prior to Admission  Medication Sig Dispense Refill Last Dose  . amLODipine (NORVASC) 5 MG tablet Take 10 mg by mouth daily.   01/28/2018 at Unknown time  . aspirin 325 MG tablet Take 325 mg by mouth daily.   01/28/2018 at Unknown time  . cephALEXin (KEFLEX) 500 MG capsule Take 500 mg by mouth 3 (three) times daily.   01/28/2018 at Unknown time  . furosemide (LASIX) 40 MG tablet Take 40 mg by mouth daily.   01/28/2018 at Unknown time  . lamoTRIgine (LAMICTAL) 100 MG tablet Take 100 mg by mouth at bedtime.   01/28/2018 at Unknown time  . levothyroxine (SYNTHROID, LEVOTHROID) 75 MCG tablet Take 75 mcg by mouth daily.   01/28/2018 at Unknown time  . LORazepam (ATIVAN) 0.5 MG tablet Take 0.5 mg by mouth every 8 (eight) hours as needed for anxiety.    Past Week at PRN  . Omega-3 Fatty Acids (FISH OIL) 1000 MG CAPS Take 1,000 mg by mouth daily.   01/28/2018 at Unknown time  . potassium chloride (K-DUR,KLOR-CON) 10 MEQ tablet Take 10 mEq by mouth daily.   01/28/2018 at Unknown time  . rivastigmine (EXELON) 4.5 MG capsule Take 4.5 mg by mouth at bedtime.   01/28/2018 at Unknown time  . venlafaxine XR (EFFEXOR-XR) 75 MG 24 hr capsule Take 75 mg by mouth daily.   01/28/2018 at Unknown time    History reviewed. No pertinent family history.   Review of Systems:   ROS Pertinent items are noted in HPI.     Cardiac Review of Systems: Y or  [    ]=  no  Chest Pain [    ]  Resting SOB [    ] Exertional SOB  [Y  ]  Orthopnea [  ]   Pedal Edema [ Y  ]    Palpitations [  ] Syncope  [  ]   Presyncope [   ]  General Review of Systems: [Y] = yes [  ]=no Constitional: recent weight change [ Y ]; anorexia [  ]; fatigue [  ]; nausea [  ]; night sweats [  ]; fever [Y  ]; or chills [ N ]                                                               Dental: Last Dentist visit:   Eye : blurred vision [  ]; diplopia [   ]; vision changes [  ];  Amaurosis fugax[  ]; Resp: cough [Y  ];  wheezing[Y  ];  hemoptysis[  ]; shortness of breath[  ]; paroxysmal nocturnal dyspnea[  ]; dyspnea on exertion[Y  ]; or orthopnea[  ];  GI:  gallstones[  ], vomiting[  ];  dysphagia[  ]; melena[  ];  hematochezia [  ]; heartburn[  ];   Hx of  Colonoscopy[  ]; GU: kidney stones [  ]; hematuria[  ];   dysuria [  ];  nocturia[  ];  history of     obstruction [  ]; urinary frequency [  ]             Skin: rash, swelling[  ];, hair loss[  ];  peripheral edema[Y  ];  or itching[  ]; Musculosketetal: myalgias[  ];  joint swelling[  ];  joint erythema[  ];  joint pain[  ];  back pain[  ];  Heme/Lymph: bruising[  ];  bleeding[  ];  anemia[  ];  Neuro: TIA[  ];  headaches[  ];  stroke[  ];  vertigo[  ];  seizures[  ];   paresthesias[  ];  difficulty walking[  ];  Psych:depression[  ]; anxiety[  ];  Endocrine: diabetes[  ];  thyroid dysfunction[  ];       Physical Exam: BP (!) 95/48   Pulse 88   Temp 98.5 F (36.9 C) (Oral)   Resp 18   Ht 5\' 6"  (1.676 m)   Wt 117.7 kg   SpO2 91%   BMI 41.87 kg/m    General appearance: alert, cooperative, slowed mentation and + down syndrome Head: atraumatic Resp: diminished breath sounds on left Cardio: regular rate and rhythm GI: soft, non-tender; bowel sounds normal; no masses,  no organomegaly Extremities: edema Pitting 3-4+ Neurologic: Grossly normal  Diagnostic Studies & Laboratory data:     Recent Radiology Findings:   Dg Chest 2 View  Result Date:  01/29/2018 CLINICAL DATA:  Shortness of breath EXAM: CHEST - 2 VIEW COMPARISON:  CT chest of 01/28/2018 and chest x-ray of the same day FINDINGS: There is persistent opacity at the left lung base which compared to the CT appears to correlate with atelectasis and possible pneumonia as well as left pleural effusion. Elevation of left hemidiaphragm again is noted. The right lung is relatively clear. Mild cardiomegaly is stable. No bony abnormality is seen. IMPRESSION: Persistent opacity at the  left lung base consistent with left pleural effusion, as well as left basilar atelectasis and possible pneumonia. Electronically Signed   By: Dwyane Dee M.D.   On: 01/29/2018 09:21     I have independently reviewed the above radiologic studies and discussed with the patient   Recent Lab Findings: Lab Results  Component Value Date   WBC 6.2 01/29/2018   HGB 11.0 (L) 01/29/2018   HCT 36.2 (L) 01/29/2018   PLT 369 01/29/2018   GLUCOSE 146 (H) 01/29/2018   ALT 12 01/29/2018   AST 18 01/29/2018   NA 139 01/29/2018   K 3.7 01/29/2018   CL 96 (L) 01/29/2018   CREATININE 1.13 01/29/2018   BUN 12 01/29/2018   CO2 32 01/29/2018    Assessment / Plan:    1. Empyema- has had multiple pleural effusions, some possibly infectious, some likely related to CHF---- 2.  H/O CHF, Pulmonic Stenosis- per father has not been followed by Cardiology as his previous Cardiologist has retired 3. OSA- newly diagnosed, followed by Pulmonology 4. Dispo- remain NPO, continue broad spectrum ABX,  Erin Barrett PA-C 01/29/2018 12:09 PM  Patient examined and CT chest images reviewed. Patients recent medical problems discussed with father. He recently has developed SOB and massive lower extremity edema. No fever , leukocytosis, tachycardia. + recent wet cough. Appetite and food intake have been good. Recent dx of OSA  CT chest shows a chronic loculated pleural effusion L base. It has been present for at least several months and has  been treated with CT guided drains at two other regional medical centers in the past 2 years- always recurring.  The patient is a poor candidate for surgery at 5'5"and 375 lbs.  He will need echocardiogram [ BNP normal], PFTs, RA ABG before considering high risk surgery. Will follow  Kerin Perna MD

## 2018-01-29 NOTE — Progress Notes (Signed)
To the best of my knowledge, documentation by L Quick [employer] nursing student is correct.

## 2018-01-30 ENCOUNTER — Inpatient Hospital Stay (HOSPITAL_COMMUNITY): Payer: Medicare Other

## 2018-01-30 ENCOUNTER — Other Ambulatory Visit: Payer: Self-pay | Admitting: *Deleted

## 2018-01-30 DIAGNOSIS — I5030 Unspecified diastolic (congestive) heart failure: Secondary | ICD-10-CM

## 2018-01-30 DIAGNOSIS — J869 Pyothorax without fistula: Secondary | ICD-10-CM

## 2018-01-30 DIAGNOSIS — I370 Nonrheumatic pulmonary valve stenosis: Secondary | ICD-10-CM

## 2018-01-30 DIAGNOSIS — G4733 Obstructive sleep apnea (adult) (pediatric): Secondary | ICD-10-CM

## 2018-01-30 LAB — PULMONARY FUNCTION TEST
DL/VA % pred: 158 %
DL/VA: 6.96 ml/min/mmHg/L
DLCO cor % pred: 107 %
DLCO cor: 28.91 ml/min/mmHg
DLCO unc % pred: 97 %
DLCO unc: 26.24 ml/min/mmHg
FEF 25-75 Pre: 0.98 L/sec
FEF2575-%Pred-Pre: 26 %
FEV1-%Pred-Pre: 33 %
FEV1-Pre: 1.25 L
FEV1FVC-%Pred-Pre: 93 %
FEV6-%Pred-Pre: 36 %
FEV6-Pre: 1.67 L
FEV6FVC-%Pred-Pre: 102 %
FVC-%Pred-Pre: 35 %
FVC-Pre: 1.67 L
Pre FEV1/FVC ratio: 75 %
Pre FEV6/FVC Ratio: 100 %

## 2018-01-30 LAB — BLOOD GAS, ARTERIAL
Acid-Base Excess: 8.9 mmol/L — ABNORMAL HIGH (ref 0.0–2.0)
Bicarbonate: 33.3 mmol/L — ABNORMAL HIGH (ref 20.0–28.0)
Drawn by: 10006
O2 Saturation: 90 %
Patient temperature: 98.6
pCO2 arterial: 49.8 mmHg — ABNORMAL HIGH (ref 32.0–48.0)
pH, Arterial: 7.441 (ref 7.350–7.450)
pO2, Arterial: 60.9 mmHg — ABNORMAL LOW (ref 83.0–108.0)

## 2018-01-30 LAB — ECHOCARDIOGRAM COMPLETE
Height: 66 in
Weight: 5643.2 oz

## 2018-01-30 LAB — CBC
HEMATOCRIT: 39.2 % (ref 39.0–52.0)
HEMOGLOBIN: 11.7 g/dL — AB (ref 13.0–17.0)
MCH: 29 pg (ref 26.0–34.0)
MCHC: 29.8 g/dL — ABNORMAL LOW (ref 30.0–36.0)
MCV: 97 fL (ref 78.0–100.0)
Platelets: 408 10*3/uL — ABNORMAL HIGH (ref 150–400)
RBC: 4.04 MIL/uL — AB (ref 4.22–5.81)
RDW: 16.6 % — ABNORMAL HIGH (ref 11.5–15.5)
WBC: 5.6 10*3/uL (ref 4.0–10.5)

## 2018-01-30 LAB — BASIC METABOLIC PANEL
Anion gap: 11 (ref 5–15)
BUN: 14 mg/dL (ref 6–20)
CHLORIDE: 97 mmol/L — AB (ref 98–111)
CO2: 31 mmol/L (ref 22–32)
CREATININE: 1.23 mg/dL (ref 0.61–1.24)
Calcium: 8 mg/dL — ABNORMAL LOW (ref 8.9–10.3)
GFR calc Af Amer: >60 mL/min (ref >60)
Glucose, Bld: 141 mg/dL — ABNORMAL HIGH (ref 70–99)
Potassium: 3.3 mmol/L — ABNORMAL LOW (ref 3.5–5.1)
Sodium: 139 mmol/L (ref 135–145)

## 2018-01-30 LAB — TSH: TSH: 5.145 u[IU]/mL — ABNORMAL HIGH (ref 0.350–4.500)

## 2018-01-30 LAB — PROTIME-INR
INR: 1.33
Prothrombin Time: 16.4 seconds — ABNORMAL HIGH (ref 11.4–15.2)

## 2018-01-30 MED ORDER — MIDAZOLAM HCL 2 MG/2ML IJ SOLN
INTRAMUSCULAR | Status: AC | PRN
  Administered 2018-01-30: 0.5 mg via INTRAVENOUS

## 2018-01-30 MED ORDER — FENTANYL CITRATE (PF) 100 MCG/2ML IJ SOLN
INTRAMUSCULAR | Status: AC | PRN
  Administered 2018-01-30: 25 ug via INTRAVENOUS

## 2018-01-30 MED ORDER — PERFLUTREN LIPID MICROSPHERE
1.0000 mL | INTRAVENOUS | Status: AC | PRN
  Administered 2018-01-30: 2 mL via INTRAVENOUS
  Filled 2018-01-30: qty 10

## 2018-01-30 MED ORDER — ENOXAPARIN SODIUM 80 MG/0.8ML ~~LOC~~ SOLN
80.0000 mg | SUBCUTANEOUS | Status: DC
  Administered 2018-01-30 – 2018-02-06 (×8): 80 mg via SUBCUTANEOUS
  Filled 2018-01-30 (×8): qty 0.8

## 2018-01-30 MED ORDER — FENTANYL CITRATE (PF) 100 MCG/2ML IJ SOLN
INTRAMUSCULAR | Status: AC
  Filled 2018-01-30: qty 2

## 2018-01-30 MED ORDER — POTASSIUM CHLORIDE CRYS ER 20 MEQ PO TBCR
40.0000 meq | EXTENDED_RELEASE_TABLET | Freq: Two times a day (BID) | ORAL | Status: AC
  Administered 2018-01-30 – 2018-01-31 (×2): 40 meq via ORAL
  Filled 2018-01-30 (×2): qty 2

## 2018-01-30 MED ORDER — FUROSEMIDE 10 MG/ML IJ SOLN
40.0000 mg | Freq: Four times a day (QID) | INTRAMUSCULAR | Status: DC
  Administered 2018-01-30 – 2018-01-31 (×4): 40 mg via INTRAVENOUS
  Filled 2018-01-30 (×4): qty 4

## 2018-01-30 MED ORDER — MIDAZOLAM HCL 2 MG/2ML IJ SOLN
INTRAMUSCULAR | Status: AC
  Filled 2018-01-30: qty 2

## 2018-01-30 MED ORDER — ENOXAPARIN SODIUM 80 MG/0.8ML ~~LOC~~ SOLN: 80.0000 mg | SUBCUTANEOUS | Status: DC

## 2018-01-30 MED ORDER — METOLAZONE 5 MG PO TABS
5.0000 mg | ORAL_TABLET | Freq: Every day | ORAL | Status: DC
  Administered 2018-01-30 – 2018-02-01 (×3): 5 mg via ORAL
  Filled 2018-01-30 (×3): qty 1

## 2018-01-30 MED ORDER — ENOXAPARIN SODIUM 40 MG/0.4ML ~~LOC~~ SOLN: 40.0000 mg | SUBCUTANEOUS | Status: DC

## 2018-01-30 MED ORDER — SODIUM CHLORIDE 0.9% FLUSH
5.0000 mL | Freq: Three times a day (TID) | INTRAVENOUS | Status: DC
  Administered 2018-01-30 – 2018-02-06 (×15): 5 mL

## 2018-01-30 NOTE — Progress Notes (Signed)
Verified with IR that they are trying to get pt in today; will be NPO as of now.

## 2018-01-30 NOTE — Progress Notes (Signed)
Report provided to [address] [facility].

## 2018-01-30 NOTE — Progress Notes (Signed)
Attempted to call 2 [facility] for report.

## 2018-01-30 NOTE — Consult Note (Signed)
Chief Complaint: Patient was seen in consultation today for empyem  Referring Physician(s): Dr. [name]  Supervising Physician: Oley Balm  Patient Status: [facility] - In-pt  History of Present Illness: Joshua Reilly is a 37 y.o. male with past medical history of Down Syndrome, CHF, and recurrent pleural effusion who presented to [facility] with shortness of breath.  A CT performed showed concern for possible empyema.  Patient was transferred to [facility] for further management.  He has been evaluated by TCTS for possible VATS procedure, but given his comorbidities is at high risk for surgical intervention. IR consulted for chest tube placement at the request of Dr. [name].   Patient has eat breakfast this AM, but has since been NPO.  He is not currently on blood thinners.    Past Medical History:  Diagnosis Date  . Abdominal hernia   . Abdominal hernia    "not repaired yet" (01/28/2018)  . CHF (congestive heart failure) (HCC)   . Down's syndrome   . Heart murmur    "born w/it" (01/28/2018)  . Pneumonia    "4-5 times" (01/28/2018)  . Pulmonary stenosis   . S/P thoracentesis 10/2017 X 2  . Sleep apnea    "waiting on machine" (01/28/2018)    Past Surgical History:  Procedure Laterality Date  . CHEST TUBE INSERTION Right ~ 2016  . CLUB FOOT RELEASE Bilateral 1980   "heel cords tightened"    Allergies: Patient has no known allergies.  Medications: Prior to Admission medications   Medication Sig Start Date End Date Taking? Authorizing Provider  amLODipine (NORVASC) 5 MG tablet Take 10 mg by mouth daily. 01/14/18  Yes [provider]  aspirin 325 MG tablet Take 325 mg by mouth daily.   Yes [provider]  cephALEXin (KEFLEX) 500 MG capsule Take 500 mg by mouth 3 (three) times daily. 01/18/18  Yes [provider]  furosemide (LASIX) 40 MG tablet Take 40 mg by mouth daily. 01/23/18  Yes [provider]  lamoTRIgine (LAMICTAL) 100 MG tablet  Take 100 mg by mouth at bedtime. 01/14/18  Yes [provider]  levothyroxine (SYNTHROID, LEVOTHROID) 75 MCG tablet Take 75 mcg by mouth daily. 01/14/18  Yes [provider]  LORazepam (ATIVAN) 0.5 MG tablet Take 0.5 mg by mouth every 8 (eight) hours as needed for anxiety.    Yes [provider]  Omega-3 Fatty Acids (FISH OIL) 1000 MG CAPS Take 1,000 mg by mouth daily.   Yes [provider]  potassium chloride (K-DUR,KLOR-CON) 10 MEQ tablet Take 10 mEq by mouth daily. 01/14/18  Yes [provider]  rivastigmine (EXELON) 4.5 MG capsule Take 4.5 mg by mouth at bedtime. 01/14/18  Yes [provider]  venlafaxine XR (EFFEXOR-XR) 75 MG 24 hr capsule Take 75 mg by mouth daily. 01/14/18  Yes [provider]     Family History  Adopted: Yes    Social History   Socioeconomic History  . Marital status: Single    Spouse name: Not on file  . Number of children: Not on file  . Years of education: Not on file  . Highest education level: Not on file  Occupational History  . Not on file  Social Needs  . Financial resource strain: Not on file  . Food insecurity:    Worry: Not on file    Inability: Not on file  . Transportation needs:    Medical: Not on file    Non-medical: Not on file  Tobacco Use  . Smoking status: Never Smoker  . Smokeless tobacco: Never Used  Substance and Sexual Activity  . Alcohol use: Never    Frequency: Never  . Drug use: Never  . Sexual activity: Never  Lifestyle  . Physical activity:    Days per week: Not on file    Minutes per session: Not on file  . Stress: Not on file  Relationships  . Social connections:    Talks on phone: Not on file    Gets together: Not on file    Attends religious service: Not on file    Active member of club or organization: Not on file    Attends meetings of clubs or organizations: Not on file    Relationship status: Not on file  Other Topics Concern  . Not on file    Social History Narrative  . Not on file     Review of Systems: A 12 point ROS discussed and pertinent positives are indicated in the HPI above.  All other systems are negative.  Review of Systems  Constitutional: Positive for fever. Negative for fatigue.  Respiratory: Positive for cough and shortness of breath.   Cardiovascular: Negative for chest pain.  Gastrointestinal: Negative for abdominal pain.  Musculoskeletal: Negative for back pain.  Psychiatric/Behavioral: Negative for behavioral problems and confusion.    Vital Signs: BP (!) 113/59   Pulse 81   Temp 98 F (36.7 C)   Resp 20   Ht 5\' 6"  (1.676 m)   Wt (!) 352 lb 11.2 oz (160 kg)   SpO2 97%   BMI 56.93 kg/m   Physical Exam  Constitutional: He is oriented to person, place, and time. He appears well-developed. No distress.  Cardiovascular: Normal rate, regular rhythm and normal heart sounds. Exam reveals no gallop and no friction rub.  No murmur heard. Pulmonary/Chest: Effort normal. No respiratory distress. He has wheezes.  Neurological: He is alert and oriented to person, place, and time.  Skin: Skin is warm and dry. He is not diaphoretic.  Psychiatric: He has a normal mood and affect. His behavior is normal.  Nursing note and vitals reviewed.   MD Evaluation Airway: WNL Heart: WNL Abdomen: WNL Chest/ Lungs: WNL ASA  Classification: 3 Mallampati/Airway Score: Three   Imaging: Dg Chest 2 View  Result Date: 01/29/2018 CLINICAL DATA:  Shortness of breath EXAM: CHEST - 2 VIEW COMPARISON:  CT chest of 01/28/2018 and chest x-ray of the same day FINDINGS: There is persistent opacity at the left lung base which compared to the CT appears to correlate with atelectasis and possible pneumonia as well as left pleural effusion. Elevation of left hemidiaphragm again is noted. The right lung is relatively clear. Mild cardiomegaly is stable. No bony abnormality is seen. IMPRESSION: Persistent opacity at the left lung base  consistent with left pleural effusion, as well as left basilar atelectasis and possible pneumonia. Electronically Signed   By: Dwyane Dee M.D.   On: 01/29/2018 09:21    Labs:  CBC: Recent Labs    01/29/18 0814 01/30/18 0440  WBC 6.2 5.6  HGB 11.0* 11.7*  HCT 36.2* 39.2  PLT 369 408*    COAGS: Recent Labs    01/30/18 0440  INR 1.33    BMP: Recent Labs    01/29/18 0814 01/30/18 0440  NA 139 139  K 3.7 3.3*  CL 96* 97*  CO2 32 31  GLUCOSE 146* 141*  BUN 12 14  CALCIUM 7.9* 8.0*  CREATININE  1.13 1.23  GFRNONAA >60 >60  GFRAA >60 >60    LIVER FUNCTION TESTS: Recent Labs    01/29/18 0814  BILITOT 0.5  AST 18  ALT 12  ALKPHOS 135*  PROT 6.4*  ALBUMIN 2.3*    TUMOR MARKERS: No results for input(s): AFPTM, CEA, CA199, CHROMGRNA in the last 8760 hours.  Assessment and Plan: Empyema Patient with history of Down Syndrome and recurrent pleural effusion.  He has had chest tubes placed in the past for same.  Admitted to [facility] for shortness of breath, now transferred to [facility] for further management.  TCTS has evaluated and deemed high risk for surgery.  IR consulted for chest tube placement.  Case reviewed and approved by Dr. Deanne Coffer.  Patient ate breakfast this AM, will make NPO for possible procedure this afternoon if schedule allows.  Discussed with father for consent.   Risks and benefits discussed with the patient including bleeding, infection, damage to adjacent structures, and sepsis.  All of the patient's questions were answered, patient is agreeable to proceed. Consent signed and in chart.  Thank you for this interesting consult.  I greatly enjoyed meeting Joshua Reilly and look forward to participating in their care.  A copy of this report was sent to the requesting provider on this date.  Electronically Signed: Hoyt Koch, PA 01/30/2018, 12:34 PM   I spent a total of 40 Minutes    in face to face in clinical consultation, greater  than 50% of which was counseling/coordinating care for empyema.

## 2018-01-30 NOTE — Progress Notes (Signed)
Patient resting comfortably during shift report. Denies complaints.  Father sleeping at bedside.

## 2018-01-30 NOTE — Procedures (Signed)
  Procedure: CT L chest tube placement 58F EBL:   minimal Complications:  none immediate  See full dictation in [employer].  Thora Lance MD Main # [phone] Pager  [phone]

## 2018-01-30 NOTE — Care Management Note (Signed)
Case Management Note  Patient Details  Name: Joshua Reilly MRN: [id number] Date of Birth: 1980-09-08  Subjective/Objective:      Empyema            Action/Plan: Patient resides at [facility] nursing home in [place]; [place] following for transition of care.  Expected Discharge Date:   possibly 02/06/2018               Expected Discharge Plan:  Skilled Nursing Facility  Discharge planning Services  CM Consult  Status of Service:  In process, will continue to follow  Reola Mosher [phone] 01/30/2018, 9:59 AM

## 2018-01-30 NOTE — Progress Notes (Addendum)
Patient removed from telemetry to go to IR where he will likely not return from, due to plans for chest tube placement which is outside of the scope for [facility].    Call placed to bed placement to rush assignment so that IR may proceed with the procedure. Given no scheduled time prior to pt pickup we were unable to plan ahead to accommodate sooner.

## 2018-01-30 NOTE — Progress Notes (Addendum)
Progress Note  Patient Name: Joshua Reilly Date of Encounter: 01/30/2018  Primary Cardiologist: Tobias Alexander, MD   Subjective   The patient feels slightly better, improved LE edema.   Inpatient Medications    Scheduled Meds: . Chlorhexidine Gluconate Cloth  6 each Topical Q0600  . enoxaparin (LOVENOX) injection  80 mg Subcutaneous Q24H  . furosemide  40 mg Intravenous BID  . metolazone  5 mg Oral Daily  . mupirocin ointment  1 application Nasal BID   Continuous Infusions: . sodium chloride Stopped (01/30/18 0421)  . piperacillin-tazobactam (ZOSYN)  IV 3.375 g (01/30/18 0421)   PRN Meds: sodium chloride, acetaminophen **OR** acetaminophen, ondansetron **OR** ondansetron (ZOFRAN) IV   Vital Signs    Vitals:   01/29/18 1943 01/30/18 0426 01/30/18 0500 01/30/18 0851  BP: 117/62 (!) 121/53  (!) 139/59  Pulse: 93 97  97  Resp: 18     Temp: 98.6 F (37 C) 99.1 F (37.3 C)    TempSrc: Oral Oral    SpO2: 95% 93%    Weight:   (!) 160 kg   Height:        Intake/Output Summary (Last 24 hours) at 01/30/2018 1014 Last data filed at 01/30/2018 0725 Gross per 24 hour  Intake 736.53 ml  Output 2800 ml  Net -2063.47 ml   Filed Weights   01/28/18 2153 01/29/18 0635 01/30/18 0500  Weight: (!) 163.9 kg 117.7 kg (!) 160 kg    Telemetry    SR - Personally Reviewed  Physical Exam  Sitting in a chair, GEN: No acute distress.   Neck: No JVD Cardiac: RRR, 2/6 systolic murmurs, rubs, or gallops.  Respiratory: decreased BS up to the mid left lung GI: Soft, nontender, non-distended  MS: 2+ B/L LE edema with increased erythema and warmth Neuro:  Nonfocal  Psych: Normal affect   Labs    Chemistry Recent Labs  Lab 01/29/18 0814 01/30/18 0440  NA 139 139  K 3.7 3.3*  CL 96* 97*  CO2 32 31  GLUCOSE 146* 141*  BUN 12 14  CREATININE 1.13 1.23  CALCIUM 7.9* 8.0*  PROT 6.4*  --   ALBUMIN 2.3*  --   AST 18  --   ALT 12  --   ALKPHOS 135*  --   BILITOT 0.5  --     GFRNONAA >60 >60  GFRAA >60 >60  ANIONGAP 11 11     Hematology Recent Labs  Lab 01/29/18 0814 01/30/18 0440  WBC 6.2 5.6  RBC 3.76* 4.04*  HGB 11.0* 11.7*  HCT 36.2* 39.2  MCV 96.3 97.0  MCH 29.3 29.0  MCHC 30.4 29.8*  RDW 16.2* 16.6*  PLT 369 408*    Cardiac Enzymes Recent Labs  Lab 01/29/18 0814 01/29/18 1029  TROPONINI <0.03 <0.03   No results for input(s): TROPIPOC in the last 168 hours.   BNP Recent Labs  Lab 01/29/18 0814  BNP 10.9     DDimer No results for input(s): DDIMER in the last 168 hours.   Radiology    Dg Chest 2 View  Result Date: 01/29/2018 CLINICAL DATA:  Shortness of breath EXAM: CHEST - 2 VIEW COMPARISON:  CT chest of 01/28/2018 and chest x-ray of the same day FINDINGS: There is persistent opacity at the left lung base which compared to the CT appears to correlate with atelectasis and possible pneumonia as well as left pleural effusion. Elevation of left hemidiaphragm again is noted. The right lung is relatively clear. Mild  cardiomegaly is stable. No bony abnormality is seen. IMPRESSION: Persistent opacity at the left lung base consistent with left pleural effusion, as well as left basilar atelectasis and possible pneumonia. Electronically Signed   By: Dwyane Dee M.D.   On: 01/29/2018 09:21    Cardiac Studies   Echo is pending  Patient Profile     37 y.o. male, very pleasant young gentleman with h/o Down Syndrome, who was adopted at age of 13 months. As a child he was diagnosed with pulmonary stenosis that didn't cause any symptoms, he was very active enrolled in multiple sports. With age he gained weight, became less active, currently lives in assisted living facility. He underwent left pleural effusion thoracentesis in June - no further details on fluid studies. He developed DOE, LE edema about 5 days ago, presented to [facility] - with negative venous US for DVT. Chest CT negative for pulmonary embolism, and showed moderate left  pleural effusion suspicious for empyema. Smaller when compared to the prior in June. The patient has had fever/chills few days ago, and productive cough.  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 Good diuresis overnight, Crea stable, Increase Lasix to 40 mg iv Q6H. Continue metolazone 5 mg po daily. Awaiting echocardiogram, systolic murmur is not loud. Further management - thoracentesis vs VATS based on echo results.   For questions or updates, please contact [facility] Please consult [email or web address] for contact info under        Signed, Tobias Alexander, MD  01/30/2018, 10:14 AM

## 2018-01-30 NOTE — Progress Notes (Signed)
  Echocardiogram 2D Echocardiogram has been performed.  Joshua Reilly 01/30/2018, 2:09 PM

## 2018-01-30 NOTE — Progress Notes (Signed)
PROGRESS NOTE    Joshua Reilly  HQI:[id number] DOB: 02-15-1981 DOA: 01/28/2018 PCP: Shelbie Ammons, MD    Brief Narrative:37 year old male, resident of an ALF, PMH of Down syndrome, pulmonary stenosis, chronic CHF, OSA awaiting CPAP, not on home oxygen, abdominal hernia, as per history provided by father, patient was hospitalized sometime in 2018 and had 2 chest tubes placed on the right side which drained thick fluid, underwent left-sided thoracentesis in June at [facility], now transferred from [facility] on 9/10 with a couple of weeks history of progressive lower extremity edema and dyspnea on mild exertion.  He has had low-grade fevers but no cough.  Evaluation at [facility] showed CTA negative for PE, loculated left pleural effusion concerning for empyema.  As per family, lower extremity Dopplers a week ago at ALF reportedly negative for DVT.  Lasix dose recently increased as outpatient.  Patient sees pulmonologist Dr. Blenda Nicely who evaluated him at [facility] ED and recommended admission to a tertiary cancer center for evaluation by CTS surgery, consideration for VATS.  Patient was thereby transferred to [facility].   Assessment & Plan:   Principal Problem:   Empyema (HCC) Active Problems:   CHF (congestive heart failure) (HCC)   OSA (obstructive sleep apnea)   Loculated left pleural effusion;  Empyema.  Prior history of right side empyema.  Continue with IV antibiotics.  CVTS recommend IR Chest tube placement.   Acute in chronic diastolic Heart failure exacerbation.  Pulmonary stenosis;  On IV lasix. 40 mg Q 6 hours.  Cardiology following.   OSA;  Apparently recently diagnosed and awaiting CPAP.  Follows with pulmonology in [place].  Morbid obesity/Body mass index is 41.87 kg/m.  Down syndrome: Mental status at baseline.  Anemia; follow trend.   DVT prophylaxis: Lovenox Code Status; full code.  Family Communication: father at bedside.  Disposition Plan: home  when stable.    Consultants:   CVTS  Cardiology    Procedures:   Chest tube  ECHO   Antimicrobials:   Zosyn    Subjective: Sitting in chair, sleepy, wake up answer questions. Per father he sleeps a lot at home too.    Objective: Vitals:   01/30/18 0426 01/30/18 0500 01/30/18 0851 01/30/18 1232  BP: (!) 121/53  (!) 139/59 (!) 113/59  Pulse: 97  97 81  Resp:    20  Temp: 99.1 F (37.3 C)   98 F (36.7 C)  TempSrc: Oral     SpO2: 93%   97%  Weight:  (!) 160 kg    Height:        Intake/Output Summary (Last 24 hours) at 01/30/2018 1553 Last data filed at 01/30/2018 1515 Gross per 24 hour  Intake 865.28 ml  Output 1775 ml  Net -909.72 ml   Filed Weights   01/28/18 2153 01/29/18 0635 01/30/18 0500  Weight: (!) 163.9 kg 117.7 kg (!) 160 kg    Examination:  General exam: Appears calm and comfortable  Respiratory system: Clear to auscultation. Respiratory effort normal. Cardiovascular system: S1 & S2 heard, RRR. No JVD, murmurs, rubs, gallops or clicck. Plus 2 edema  Gastrointestinal system: Abdomen is nondistended, soft and nontender. No organomegaly or masses felt. Normal bowel sounds heard. Central nervous system: Alert and oriented.  Extremities: Symmetric 5 x 5 power. Skin: No rashes, lesions or ulcers   Data Reviewed: I have personally reviewed following labs and imaging studies  CBC: Recent Labs  Lab 01/29/18 0814 01/30/18 0440  WBC 6.2 5.6  HGB  11.0* 11.7*  HCT 36.2* 39.2  MCV 96.3 97.0  PLT 369 408*   Basic Metabolic Panel: Recent Labs  Lab 01/29/18 0814 01/30/18 0440  NA 139 139  K 3.7 3.3*  CL 96* 97*  CO2 32 31  GLUCOSE 146* 141*  BUN 12 14  CREATININE 1.13 1.23  CALCIUM 7.9* 8.0*   GFR: Estimated Creatinine Clearance: 116.7 mL/min (by C-G formula based on SCr of 1.23 mg/dL). Liver Function Tests: Recent Labs  Lab 01/29/18 0814  AST 18  ALT 12  ALKPHOS 135*  BILITOT 0.5  PROT 6.4*  ALBUMIN 2.3*   No results for  input(s): LIPASE, AMYLASE in the last 168 hours. No results for input(s): AMMONIA in the last 168 hours. Coagulation Profile: Recent Labs  Lab 01/30/18 0440  INR 1.33   Cardiac Enzymes: Recent Labs  Lab 01/29/18 0814 01/29/18 1029  TROPONINI <0.03 <0.03   BNP (last 3 results) No results for input(s): PROBNP in the last 8760 hours. HbA1C: No results for input(s): HGBA1C in the last 72 hours. CBG: No results for input(s): GLUCAP in the last 168 hours. Lipid Profile: No results for input(s): CHOL, HDL, LDLCALC, TRIG, CHOLHDL, LDLDIRECT in the last 72 hours. Thyroid Function Tests: Recent Labs    01/30/18 0440  TSH 5.145*   Anemia Panel: No results for input(s): VITAMINB12, FOLATE, FERRITIN, TIBC, IRON, RETICCTPCT in the last 72 hours. Sepsis Labs: No results for input(s): PROCALCITON, LATICACIDVEN in the last 168 hours.  Recent Results (from the past 240 hour(s))  MRSA PCR Screening     Status: Abnormal   Collection Time: 01/29/18  2:24 AM  Result Value Ref Range Status   MRSA by PCR POSITIVE (A) NEGATIVE Final    Comment:        The GeneXpert MRSA Assay (FDA approved for NASAL specimens only), is one component of a comprehensive MRSA colonization surveillance program. It is not intended to diagnose MRSA infection nor to guide or monitor treatment for MRSA infections. RESULT CALLED TO, READ BACK BY AND VERIFIED WITHLeta Baptist RN [phone] 01/29/18 A BROWNING Performed at [facility], [address], [place], [place] [address]   Surgical pcr screen     Status: None   Collection Time: 01/29/18  4:08 PM  Result Value Ref Range Status   MRSA, PCR NEGATIVE NEGATIVE Final   Staphylococcus aureus NEGATIVE NEGATIVE Final    Comment: (NOTE) The Xpert SA Assay (FDA approved for NASAL specimens in patients 30 years of age and older), is one component of a comprehensive surveillance program. It is not intended to diagnose infection nor to guide or monitor  treatment. Performed at [facility], [address], [place], [place] [address]          Radiology Studies: Dg Chest 2 View  Result Date: 01/29/2018 CLINICAL DATA:  Shortness of breath EXAM: CHEST - 2 VIEW COMPARISON:  CT chest of 01/28/2018 and chest x-ray of the same day FINDINGS: There is persistent opacity at the left lung base which compared to the CT appears to correlate with atelectasis and possible pneumonia as well as left pleural effusion. Elevation of left hemidiaphragm again is noted. The right lung is relatively clear. Mild cardiomegaly is stable. No bony abnormality is seen. IMPRESSION: Persistent opacity at the left lung base consistent with left pleural effusion, as well as left basilar atelectasis and possible pneumonia. Electronically Signed   By: Dwyane Dee M.D.   On: 01/29/2018 09:21  Scheduled Meds: . Chlorhexidine Gluconate Cloth  6 each Topical Q0600  . enoxaparin (LOVENOX) injection  80 mg Subcutaneous Q24H  . furosemide  40 mg Intravenous Q6H  . metolazone  5 mg Oral Daily  . mupirocin ointment  1 application Nasal BID   Continuous Infusions: . sodium chloride 10 mL/hr at 01/30/18 1351  . piperacillin-tazobactam (ZOSYN)  IV 3.375 g (01/30/18 1351)     LOS: 2 days    Time spent: 35 minutes.     Alba Cory, MD [facility] Pager [phone]  If 7PM-7AM, please contact night-coverage [email or web address] Password [facility] 01/30/2018, 3:53 PM

## 2018-01-31 ENCOUNTER — Inpatient Hospital Stay (HOSPITAL_COMMUNITY): Payer: Medicare Other

## 2018-01-31 DIAGNOSIS — I5031 Acute diastolic (congestive) heart failure: Secondary | ICD-10-CM

## 2018-01-31 DIAGNOSIS — R0609 Other forms of dyspnea: Secondary | ICD-10-CM

## 2018-01-31 DIAGNOSIS — R0600 Dyspnea, unspecified: Secondary | ICD-10-CM

## 2018-01-31 LAB — CBC
HEMATOCRIT: 41.1 % (ref 39.0–52.0)
HEMOGLOBIN: 12.4 g/dL — AB (ref 13.0–17.0)
MCH: 28.7 pg (ref 26.0–34.0)
MCHC: 30.2 g/dL (ref 30.0–36.0)
MCV: 95.1 fL (ref 78.0–100.0)
Platelets: 348 10*3/uL (ref 150–400)
RBC: 4.32 MIL/uL (ref 4.22–5.81)
RDW: 16.4 % — ABNORMAL HIGH (ref 11.5–15.5)
WBC: 6.3 10*3/uL (ref 4.0–10.5)

## 2018-01-31 LAB — BASIC METABOLIC PANEL
Anion gap: 14 (ref 5–15)
BUN: 16 mg/dL (ref 6–20)
CHLORIDE: 93 mmol/L — AB (ref 98–111)
CO2: 30 mmol/L (ref 22–32)
CREATININE: 1.45 mg/dL — AB (ref 0.61–1.24)
Calcium: 8.1 mg/dL — ABNORMAL LOW (ref 8.9–10.3)
GFR calc non Af Amer: 59 mL/min — ABNORMAL LOW (ref >60)
Glucose, Bld: 143 mg/dL — ABNORMAL HIGH (ref 70–99)
POTASSIUM: 3.2 mmol/L — AB (ref 3.5–5.1)
SODIUM: 137 mmol/L (ref 135–145)

## 2018-01-31 MED ORDER — VANCOMYCIN HCL 10 G IV SOLR
1500.0000 mg | Freq: Three times a day (TID) | INTRAVENOUS | Status: DC
  Administered 2018-01-31 – 2018-02-02 (×6): 1500 mg via INTRAVENOUS
  Filled 2018-01-31 (×7): qty 1500

## 2018-01-31 MED ORDER — POTASSIUM CHLORIDE CRYS ER 20 MEQ PO TBCR
20.0000 meq | EXTENDED_RELEASE_TABLET | Freq: Once | ORAL | Status: AC
  Administered 2018-01-31: 20 meq via ORAL
  Filled 2018-01-31: qty 1

## 2018-01-31 MED ORDER — FUROSEMIDE 10 MG/ML IJ SOLN
40.0000 mg | Freq: Two times a day (BID) | INTRAMUSCULAR | Status: DC
  Administered 2018-01-31 – 2018-02-01 (×2): 40 mg via INTRAVENOUS
  Filled 2018-01-31 (×2): qty 4

## 2018-01-31 MED ORDER — VANCOMYCIN HCL 10 G IV SOLR
2500.0000 mg | Freq: Once | INTRAVENOUS | Status: AC
  Administered 2018-01-31: 2500 mg via INTRAVENOUS
  Filled 2018-01-31: qty 2500

## 2018-01-31 NOTE — Progress Notes (Signed)
Pharmacy Antibiotic Note  Joshua Reilly is a 37 y.o. male admitted on 01/28/2018, now with concern for pneumonia/empyema.  Pharmacy has been consulted for vancomycin dosing.  Plan: Vancomycin vancomycin 2500mg  x1 followed by 1500mg  IV every 8 hours.  Goal trough 15-20 mcg/mL.  Height: 5\' 6"  (167.6 cm) Weight: (!) 352 lb 11.2 oz (160 kg) IBW/kg (Calculated) : 63.8  Temp (24hrs), Avg:98.2 F (36.8 C), Min:97.7 F (36.5 C), Max:98.8 F (37.1 C)  Recent Labs  Lab 01/29/18 0814 01/30/18 0440 01/31/18 0414  WBC 6.2 5.6 6.3  CREATININE 1.13 1.23 1.45*    Estimated Creatinine Clearance: 99 mL/min (A) (by C-G formula based on SCr of 1.45 mg/dL (H)).    No Known Allergies   Thank you for allowing pharmacy to be a part of this patient's care.  Joshua Reilly, PharmD, BCPS  01/31/2018 7:58 AM

## 2018-01-31 NOTE — Progress Notes (Signed)
Procedure(s) (LRB): VIDEO ASSISTED THORACOSCOPY (VATS)/EMPYEMA (Left) Subjective: Severe edema and low albumin- cardiology following Echo with good biventricular function- no evidence endocarditis IR tube in good position to drain L subpulmonic chronic empyema Very poor mechanics on PFTs with hypoventillation , hypercarbia on ABG Poor candidate for VATS at this time- treat with tube drainage, antibiotics and follow  Objective: Vital signs in last 24 hours: Temp:  [97.7 F (36.5 C)-98.8 F (37.1 C)] 98.8 F (37.1 C) (09/11 2328) Pulse Rate:  [81-97] 90 (09/11 2328) Cardiac Rhythm: Normal sinus rhythm (09/11 2040) Resp:  [14-20] 18 (09/11 2328) BP: (95-139)/(51-66) 114/64 (09/11 2328) SpO2:  [93 %-98 %] 93 % (09/11 2328)  Hemodynamic parameters for last 24 hours:    Intake/Output from previous day: 09/11 0701 - 09/12 0700 In: 291.8 [P.O.:240; I.V.:4.4; IV Piggyback:47.4] Out: 1850 [Urine:1550; Drains:300] Intake/Output this shift: No intake/output data recorded.  Pigtail catheter in place  Lab Results: Recent Labs    01/30/18 0440 01/31/18 0414  WBC 5.6 6.3  HGB 11.7* 12.4*  HCT 39.2 41.1  PLT 408* 348   BMET:  Recent Labs    01/30/18 0440 01/31/18 0414  NA 139 137  K 3.3* 3.2*  CL 97* 93*  CO2 31 30  GLUCOSE 141* 143*  BUN 14 16  CREATININE 1.23 1.45*  CALCIUM 8.0* 8.1*    PT/INR:  Recent Labs    01/30/18 0440  LABPROT 16.4*  INR 1.33   ABG    Component Value Date/Time   PHART 7.441 01/30/2018 0530   HCO3 33.3 (H) 01/30/2018 0530   O2SAT 90.0 01/30/2018 0530   CBG (last 3)  No results for input(s): GLUCAP in the last 72 hours.  Assessment/Plan: S/P Procedure(s) (LRB): VIDEO ASSISTED THORACOSCOPY (VATS)/EMPYEMA (Left) Hold on surgery and follow response to tube drainage, antibiotics- no general sepsis Pulmonary consult to assist in care if patient needs thracotomy   LOS: 3 days    Joshua Reilly 01/31/2018

## 2018-01-31 NOTE — Progress Notes (Signed)
Nutrition Consult/Brief Note  RD consulted for hypoalbuminemia.   Patient admitted with empyema, CHF and Down Syndrome.  Albumin is not an indicator of nutritional status, but rather an indicator of morbidity and mortality, and recovery from acute and chronic illness.  Wt Readings from Last 15 Encounters:  01/30/18 (!) 160 kg   Body mass index is 56.93 kg/m. Patient meets criteria for Obesity Class III based on current BMI.   Current diet order is Heart Healthy. No nutrition problems identified PTA. Appetite good.  Labs and medications reviewed. No nutrition interventions warranted at this time.  If nutrition issues arise, please re-consult RD.   Joshua ChattersKatie Melayna Reilly, RD, LDN Pager #: [phone] After-Hours Pager #: [phone]

## 2018-01-31 NOTE — Progress Notes (Signed)
Progress Note  Patient Name: Joshua Reilly Date of Encounter: 01/31/2018  Primary Cardiologist: Tobias Alexander, MD   Subjective   Improved breathing, no chest pain, improved LE edema.  Inpatient Medications    Scheduled Meds: . Chlorhexidine Gluconate Cloth  6 each Topical Q0600  . enoxaparin (LOVENOX) injection  80 mg Subcutaneous Q24H  . furosemide  40 mg Intravenous Q6H  . metolazone  5 mg Oral Daily  . mupirocin ointment  1 application Nasal BID  . potassium chloride  40 mEq Oral BID  . sodium chloride flush  5 mL Intracatheter Q8H   Continuous Infusions: . sodium chloride 10 mL/hr at 01/30/18 1351  . piperacillin-tazobactam (ZOSYN)  IV 3.375 g (01/31/18 0606)  . vancomycin    . vancomycin     PRN Meds: sodium chloride, acetaminophen **OR** acetaminophen, ondansetron **OR** ondansetron (ZOFRAN) IV   Vital Signs    Vitals:   01/30/18 1630 01/30/18 1635 01/30/18 1710 01/30/18 2328  BP: 116/62 95/66 101/62 114/64  Pulse: 87 87 85 90  Resp: 14 16  18   Temp:   97.7 F (36.5 C) 98.8 F (37.1 C)  TempSrc:   Oral Oral  SpO2: 96% 96% 94% 93%  Weight:      Height:        Intake/Output Summary (Last 24 hours) at 01/31/2018 0852 Last data filed at 01/30/2018 2020 Gross per 24 hour  Intake 274.12 ml  Output 2150 ml  Net -1875.88 ml   Filed Weights   01/28/18 2153 01/29/18 0635 01/30/18 0500  Weight: (!) 163.9 kg 117.7 kg (!) 160 kg    Telemetry    SR - Personally Reviewed  Physical Exam  In bed, laying flat GEN: No acute distress.   Neck: No JVD Cardiac: RRR, 2/6 systolic murmurs, rubs, or gallops.  Respiratory: decreased BS up to the mid left lung, drain in the left base GI: Soft, nontender, non-distended  MS: 1+ B/L LE edema with decreased erythema Neuro:  Nonfocal  Psych: flat affect   Labs    Chemistry Recent Labs  Lab 01/29/18 0814 01/30/18 0440 01/31/18 0414  NA 139 139 137  K 3.7 3.3* 3.2*  CL 96* 97* 93*  CO2 32 31 30  GLUCOSE 146*  141* 143*  BUN 12 14 16   CREATININE 1.13 1.23 1.45*  CALCIUM 7.9* 8.0* 8.1*  PROT 6.4*  --   --   ALBUMIN 2.3*  --   --   AST 18  --   --   ALT 12  --   --   ALKPHOS 135*  --   --   BILITOT 0.5  --   --   GFRNONAA >60 >60 59*  GFRAA >60 >60 >60  ANIONGAP 11 11 14      Hematology Recent Labs  Lab 01/29/18 0814 01/30/18 0440 01/31/18 0414  WBC 6.2 5.6 6.3  RBC 3.76* 4.04* 4.32  HGB 11.0* 11.7* 12.4*  HCT 36.2* 39.2 41.1  MCV 96.3 97.0 95.1  MCH 29.3 29.0 28.7  MCHC 30.4 29.8* 30.2  RDW 16.2* 16.6* 16.4*  PLT 369 408* 348    Cardiac Enzymes Recent Labs  Lab 01/29/18 0814 01/29/18 1029  TROPONINI <0.03 <0.03   No results for input(s): TROPIPOC in the last 168 hours.   BNP Recent Labs  Lab 01/29/18 0814  BNP 10.9     DDimer No results for input(s): DDIMER in the last 168 hours.   Radiology    Ct Image Guided Drainage By  Percutaneous Catheter  Result Date: 01/30/2018 CLINICAL DATA:  Left sub pulmonic empyema EXAM: CT GUIDED LEFT PLEURAL DRAIN PLACEMENT ANESTHESIA/SEDATION: Intravenous Fentanyl and Versed were administered as conscious sedation during continuous monitoring of the patient's level of consciousness and physiological / cardiorespiratory status by the radiology RN, with a total moderate sedation time of 14 minutes. PROCEDURE: The procedure, risks, benefits, and alternatives were explained to the patient and parent. Questions regarding the procedure were encouraged and answered. The parent understands and consents to the procedure. Patient placed right lateral decubitus. Limited axial scans through the thorax obtained. The left sub pulmonic collection was localized and an appropriate skin entry site was determined and marked. The operative field was prepped with chlorhexidinein a sterile fashion, and a sterile drape was applied covering the operative field. A sterile gown and sterile gloves were used for the procedure. Local anesthesia was provided with 1%  Lidocaine. Under CT fluoroscopic guidance, a 19 gauge percutaneous entry needle was advanced into the sub pulmonic collection. Greenish purulent material could be aspirated. An Amplatz guidewire advanced easily within the collection, its position confirmed on CT. Tract dilated to facilitate placement of a 12 French pigtail drain catheter, formed centrally within the collection. CT confirms appropriate positioning. Catheter secured externally with a externally with 0 Prolene suture and StatLock and placed a Pleur-evac drainage. 20 mL aspirate was sent for Gram stain and culture. The patient tolerated the procedure well. COMPLICATIONS: None immediate FINDINGS: Left loculated sub pulmonic collection was again localized. 12 French pigtail drain catheter placed. 20 mL of the purulent aspirate was sent for Gram stain and culture. IMPRESSION: 1. Technically successful left pleural drain catheter placement under CT guidance. Electronically Signed   By: Corlis Leak  Hassell M.D.   On: 01/30/2018 17:02    Cardiac Studies   Echo 01/30/2018  - Left ventricle: The cavity size was normal. Wall thickness was   normal. Systolic function was normal. The estimated ejection   fraction was in the range of 60% to 65%. Although no diagnostic   regional wall motion abnormality was identified, this possibility   cannot be completely excluded on the basis of this study.   Features are consistent with a pseudonormal left ventricular   filling pattern, with concomitant abnormal relaxation and   increased filling pressure (grade 2 diastolic dysfunction). - Aortic valve: There was no stenosis. - Mitral valve: There was no significant regurgitation. - Right ventricle: The cavity size was normal. Systolic function   was normal. - Pulmonic valve: The findings are consistent with mild stenosis.   There was no regurgitation. Peak gradient (S): 23 mm Hg. - Pulmonary arteries: No complete TR doppler jet so unable to   estimate PA systolic  pressure. - Inferior vena cava: The vessel was normal in size. The   respirophasic diameter changes were in the normal range (>= 50%),   consistent with normal central venous pressure.  Impressions:  - Normal LV size with EF 60-65%. Moderate diastolic dysfunction.   Normal RV size and systolic function. Mild pulmonic stenosis with   peak gradient 23 mmHg.   Patient Profile     37 y.o. male, very pleasant young gentleman with h/o Down Syndrome, who was adopted at age of 146 months. As a child he was diagnosed with pulmonary stenosis that didn't cause any symptoms, he was very active enrolled in multiple sports. With age he gained weight, became less active, currently lives in assisted living facility. He underwent left pleural effusion thoracentesis in June -  no further details on fluid studies. He developed DOE, LE edema about 5 days ago, presented to [facility] - with negative venous US for DVT. Chest CT negative for pulmonary embolism, and showed moderate left pleural effusion suspicious for empyema. Smaller when compared to the prior in June. The patient has had fever/chills few days ago, and productive cough.  Assessment & Plan    A/P Pneumonia with empyema Pulmonary stenosis  The patient underwent thoracentesis yesterday with removal of milky appearing fluid, drain left in place, overnight 400 cc, overall improved shortness of breath, significantly improved lower extremity edema now with decreased erythema and warmth.  Creatinine slightly up from 1.2-1.4, the patient has hypoalbuminemia. Echocardiogram showed normal left and right systolic function, grade 2 diastolic dysfunction with evidence for elevated filling pressures, and only mild pulmonary stenosis. Per Dr. Alla German he is a poor candidate for VATS.  Decrease Lasix to 40 mg IV every 12 hours continue metolazone for now monitor creatinine closely.   For questions or updates, please contact [facility] Please  consult [email or web address] for contact info under     Signed, Tobias Alexander, MD  01/31/2018, 8:52 AM

## 2018-01-31 NOTE — Progress Notes (Signed)
PROGRESS NOTE    Joshua Reilly  ZOX:[id number] DOB: [birth year] DOA: 01/28/2018 PCP: Shelbie Ammons, MD    Brief Narrative:37 year old male, resident of an ALF, PMH of Down syndrome, pulmonary stenosis, chronic CHF, OSA awaiting CPAP, not on home oxygen, abdominal hernia, as per history provided by father, patient was hospitalized sometime in 2018 and had 2 chest tubes placed on the right side which drained thick fluid, underwent left-sided thoracentesis in June at [facility], now transferred from [facility] on 9/10 with a couple of weeks history of progressive lower extremity edema and dyspnea on mild exertion.  He has had low-grade fevers but no cough.  Evaluation at [facility] showed CTA negative for PE, loculated left pleural effusion concerning for empyema.  As per family, lower extremity Dopplers a week ago at ALF reportedly negative for DVT.  Lasix dose recently increased as outpatient.  Patient sees pulmonologist Dr. Blenda Nicely who evaluated him at [facility] ED and recommended admission to a tertiary cancer center for evaluation by CTS surgery, consideration for VATS.  Patient was thereby transferred to [facility].   Assessment & Plan:   Principal Problem:   Empyema (HCC) Active Problems:   CHF (congestive heart failure) (HCC)   OSA (obstructive sleep apnea)   Dyspnea   Acute diastolic heart failure (HCC)   Loculated left pleural effusion;  Empyema.  Prior history of right side empyema.  Continue with IV antibiotics.  CVTS recommend IR Chest tube placement.  Underwent chest tube placement by IR 9-12.  Culture growing gram positive cocci.  Chest x ray stable.   Acute in chronic diastolic Heart failure exacerbation.  Pulmonary stenosis;  Lasix change to BID. Mildly increase cr.  ECHO diastolic dysfunction.  Cardiology following.   OSA;  Apparently recently diagnosed and awaiting CPAP.  Follows with pulmonology in [place].  Morbid obesity/Body mass index is 41.87  kg/m.  Down syndrome: Mental status at baseline.  Anemia; follow trend.  Hb stable.   DVT prophylaxis: Lovenox Code Status; full code.  Family Communication: father at bedside.  Disposition Plan: home when stable.    Consultants:   CVTS  Cardiology    Procedures:   Chest tube  ECHO   Antimicrobials:   Zosyn    Subjective: In bed, report improvement of dyspnea after chest tube.   Objective: Vitals:   01/30/18 1630 01/30/18 1635 01/30/18 1710 01/30/18 2328  BP: 116/62 95/66 101/62 114/64  Pulse: 87 87 85 90  Resp: 14 16  18   Temp:   97.7 F (36.5 C) 98.8 F (37.1 C)  TempSrc:   Oral Oral  SpO2: 96% 96% 94% 93%  Weight:      Height:        Intake/Output Summary (Last 24 hours) at 01/31/2018 1446 Last data filed at 01/31/2018 1100 Gross per 24 hour  Intake 34.12 ml  Output 2250 ml  Net -2215.88 ml   Filed Weights   01/28/18 2153 01/29/18 0635 01/30/18 0500  Weight: (!) 163.9 kg 117.7 kg (!) 160 kg    Examination:  General exam: NAD Respiratory system: crackles bases.  Cardiovascular system: S 1, S 2 RRR Gastrointestinal system: BS present, soft, nt Central nervous system: alert.  Extremities: Symmetric power.  Skin: mild redness    Data Reviewed: I have personally reviewed following labs and imaging studies  CBC: Recent Labs  Lab 01/29/18 0814 01/30/18 0440 01/31/18 0414  WBC 6.2 5.6 6.3  HGB 11.0* 11.7* 12.4*  HCT 36.2* 39.2 41.1  MCV 96.3  97.0 95.1  PLT 369 408* 348   Basic Metabolic Panel: Recent Labs  Lab 01/29/18 0814 01/30/18 0440 01/31/18 0414  NA 139 139 137  K 3.7 3.3* 3.2*  CL 96* 97* 93*  CO2 32 31 30  GLUCOSE 146* 141* 143*  BUN 12 14 16   CREATININE 1.13 1.23 1.45*  CALCIUM 7.9* 8.0* 8.1*   GFR: Estimated Creatinine Clearance: 99 mL/min (A) (by C-G formula based on SCr of 1.45 mg/dL (H)). Liver Function Tests: Recent Labs  Lab 01/29/18 0814  AST 18  ALT 12  ALKPHOS 135*  BILITOT 0.5  PROT 6.4*    ALBUMIN 2.3*   No results for input(s): LIPASE, AMYLASE in the last 168 hours. No results for input(s): AMMONIA in the last 168 hours. Coagulation Profile: Recent Labs  Lab 01/30/18 0440  INR 1.33   Cardiac Enzymes: Recent Labs  Lab 01/29/18 0814 01/29/18 1029  TROPONINI <0.03 <0.03   BNP (last 3 results) No results for input(s): PROBNP in the last 8760 hours. HbA1C: No results for input(s): HGBA1C in the last 72 hours. CBG: No results for input(s): GLUCAP in the last 168 hours. Lipid Profile: No results for input(s): CHOL, HDL, LDLCALC, TRIG, CHOLHDL, LDLDIRECT in the last 72 hours. Thyroid Function Tests: Recent Labs    01/30/18 0440  TSH 5.145*   Anemia Panel: No results for input(s): VITAMINB12, FOLATE, FERRITIN, TIBC, IRON, RETICCTPCT in the last 72 hours. Sepsis Labs: No results for input(s): PROCALCITON, LATICACIDVEN in the last 168 hours.  Recent Results (from the past 240 hour(s))  MRSA PCR Screening     Status: Abnormal   Collection Time: 01/29/18  2:24 AM  Result Value Ref Range Status   MRSA by PCR POSITIVE (A) NEGATIVE Final    Comment:        The GeneXpert MRSA Assay (FDA approved for NASAL specimens only), is one component of a comprehensive MRSA colonization surveillance program. It is not intended to diagnose MRSA infection nor to guide or monitor treatment for MRSA infections. RESULT CALLED TO, READ BACK BY AND VERIFIED WITHLeta Baptist RN [phone] 01/29/18 A BROWNING Performed at [facility], [address], [place], [place] [address]   Surgical pcr screen     Status: None   Collection Time: 01/29/18  4:08 PM  Result Value Ref Range Status   MRSA, PCR NEGATIVE NEGATIVE Final   Staphylococcus aureus NEGATIVE NEGATIVE Final    Comment: (NOTE) The Xpert SA Assay (FDA approved for NASAL specimens in patients 39 years of age and older), is one component of a comprehensive surveillance program. It is not intended to diagnose infection nor  to guide or monitor treatment. Performed at [facility], [address] [place], [place] [address]   Aerobic/Anaerobic Culture (surgical/deep wound)     Status: None (Preliminary result)   Collection Time: 01/30/18  4:49 PM  Result Value Ref Range Status   Specimen Description PLEURAL  Final   Special Requests NONE  Final   Gram Stain   Final    ABUNDANT WBC PRESENT, PREDOMINANTLY PMN MODERATE GRAM POSITIVE COCCI    Culture   Final    NO GROWTH < 24 HOURS Performed at [facility], [address], [place], [place] [address]    Report Status PENDING  Incomplete         Radiology Studies: Dg Chest 2 View  Result Date: 01/31/2018 CLINICAL DATA:  Shortness of breath EXAM: CHEST - 2 VIEW  COMPARISON:  01/29/18 FINDINGS: Cardiac shadow is prominent and accentuated by the frontal technique. The overall inspiratory effort is poor. There is a new drainage catheter in the left pleural space inferiorly with significant reduction in pleural effusion. Some left basilar atelectasis remains. No bony abnormality is seen. IMPRESSION: Reduction in left pleural effusion following chest tube placement. Mild left basilar atelectasis remains. Electronically Signed   By: Alcide CleverMark  Lukens M.D.   On: 01/31/2018 10:03   Ct Image Guided Drainage By Percutaneous Catheter  Result Date: 01/30/2018 CLINICAL DATA:  Left sub pulmonic empyema EXAM: CT GUIDED LEFT PLEURAL DRAIN PLACEMENT ANESTHESIA/SEDATION: Intravenous Fentanyl and Versed were administered as conscious sedation during continuous monitoring of the patient's level of consciousness and physiological / cardiorespiratory status by the radiology RN, with a total moderate sedation time of 14 minutes. PROCEDURE: The procedure, risks, benefits, and alternatives were explained to the patient and parent. Questions regarding the procedure were encouraged and answered. The parent understands and consents to the procedure. Patient placed right lateral  decubitus. Limited axial scans through the thorax obtained. The left sub pulmonic collection was localized and an appropriate skin entry site was determined and marked. The operative field was prepped with chlorhexidinein a sterile fashion, and a sterile drape was applied covering the operative field. A sterile gown and sterile gloves were used for the procedure. Local anesthesia was provided with 1% Lidocaine. Under CT fluoroscopic guidance, a 19 gauge percutaneous entry needle was advanced into the sub pulmonic collection. Greenish purulent material could be aspirated. An Amplatz guidewire advanced easily within the collection, its position confirmed on CT. Tract dilated to facilitate placement of a 12 French pigtail drain catheter, formed centrally within the collection. CT confirms appropriate positioning. Catheter secured externally with a externally with 0 Prolene suture and StatLock and placed a Pleur-evac drainage. 20 mL aspirate was sent for Gram stain and culture. The patient tolerated the procedure well. COMPLICATIONS: None immediate FINDINGS: Left loculated sub pulmonic collection was again localized. 12 French pigtail drain catheter placed. 20 mL of the purulent aspirate was sent for Gram stain and culture. IMPRESSION: 1. Technically successful left pleural drain catheter placement under CT guidance. Electronically Signed   By: Corlis Leak  Hassell M.D.   On: 01/30/2018 17:02        Scheduled Meds: . Chlorhexidine Gluconate Cloth  6 each Topical Q0600  . enoxaparin (LOVENOX) injection  80 mg Subcutaneous Q24H  . furosemide  40 mg Intravenous Q12H  . metolazone  5 mg Oral Daily  . mupirocin ointment  1 application Nasal BID  . potassium chloride  20 mEq Oral Once  . sodium chloride flush  5 mL Intracatheter Q8H   Continuous Infusions: . sodium chloride 10 mL/hr at 01/30/18 1351  . piperacillin-tazobactam (ZOSYN)  IV 3.375 g (01/31/18 1222)  . vancomycin       LOS: 3 days    Time spent: 35  minutes.     Alba CoryBelkys A Nyjai Graff, MD [facility] Pager [phone]  If 7PM-7AM, please contact night-coverage [email or web address] Password [email or web address] 01/31/2018, 2:46 PM

## 2018-02-01 ENCOUNTER — Encounter (HOSPITAL_COMMUNITY)
Admission: AD | Disposition: A | Discharge status: Home / Self Care | Payer: Self-pay | Source: Other Acute Inpatient Hospital | Attending: Internal Medicine

## 2018-02-01 DIAGNOSIS — R0602 Shortness of breath: Secondary | ICD-10-CM

## 2018-02-01 LAB — BASIC METABOLIC PANEL
ANION GAP: 13 (ref 5–15)
BUN: 22 mg/dL — ABNORMAL HIGH (ref 6–20)
CALCIUM: 8.4 mg/dL — AB (ref 8.9–10.3)
CO2: 33 mmol/L — ABNORMAL HIGH (ref 22–32)
Chloride: 91 mmol/L — ABNORMAL LOW (ref 98–111)
Creatinine, Ser: 1.5 mg/dL — ABNORMAL HIGH (ref 0.61–1.24)
GFR, EST NON AFRICAN AMERICAN: 57 mL/min — AB (ref >60)
Glucose, Bld: 140 mg/dL — ABNORMAL HIGH (ref 70–99)
POTASSIUM: 2.7 mmol/L — AB (ref 3.5–5.1)
Sodium: 137 mmol/L (ref 135–145)

## 2018-02-01 LAB — CBC
HEMATOCRIT: 42.8 % (ref 39.0–52.0)
HEMOGLOBIN: 13.2 g/dL (ref 13.0–17.0)
MCH: 29.4 pg (ref 26.0–34.0)
MCHC: 30.8 g/dL (ref 30.0–36.0)
MCV: 95.3 fL (ref 78.0–100.0)
Platelets: 343 10*3/uL (ref 150–400)
RBC: 4.49 MIL/uL (ref 4.22–5.81)
RDW: 16.7 % — AB (ref 11.5–15.5)
WBC: 7.9 10*3/uL (ref 4.0–10.5)

## 2018-02-01 SURGERY — VIDEO ASSISTED THORACOSCOPY (VATS)/EMPYEMA
Anesthesia: General | Site: Chest | Laterality: Left

## 2018-02-01 MED ORDER — FUROSEMIDE 40 MG PO TABS
40.0000 mg | ORAL_TABLET | Freq: Two times a day (BID) | ORAL | Status: DC
  Administered 2018-02-02 – 2018-02-06 (×10): 40 mg via ORAL
  Filled 2018-02-01 (×11): qty 1

## 2018-02-01 MED ORDER — METOLAZONE 2.5 MG PO TABS
2.5000 mg | ORAL_TABLET | Freq: Every day | ORAL | Status: DC
  Administered 2018-02-02 – 2018-02-03 (×2): 2.5 mg via ORAL
  Filled 2018-02-01 (×2): qty 1

## 2018-02-01 MED ORDER — POTASSIUM CHLORIDE CRYS ER 20 MEQ PO TBCR
40.0000 meq | EXTENDED_RELEASE_TABLET | Freq: Two times a day (BID) | ORAL | Status: DC
  Administered 2018-02-01 – 2018-02-03 (×6): 40 meq via ORAL
  Filled 2018-02-01 (×6): qty 2

## 2018-02-01 MED ORDER — POTASSIUM CHLORIDE 10 MEQ/100ML IV SOLN
10.0000 meq | INTRAVENOUS | Status: AC
  Administered 2018-02-01 (×3): 10 meq via INTRAVENOUS
  Filled 2018-02-01 (×3): qty 100

## 2018-02-01 NOTE — Plan of Care (Signed)
Discussed plan of care for the evening with patient and his father.  Explained medications would increase the number of times patient would void.  Father has great understanding on medicines and treatments given.  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  Father displays good teach back.  Patient displays minimal teach back.

## 2018-02-01 NOTE — Progress Notes (Signed)
PROGRESS NOTE    Joshua Reilly  UJW:[id number] DOB: 04/19/1981 DOA: 01/28/2018 PCP: [name], Imran P, MD    Brief Narrative:37 year old male, resident of an ALF, PMH of Down syndrome, pulmonary stenosis, chronic CHF, OSA awaiting CPAP, not on home oxygen, abdominal hernia, as per history provided by father, patient was hospitalized sometime in 2018 and had 2 chest tubes placed on the right side which drained thick fluid, underwent left-sided thoracentesis in June at [facility], now transferred from [facility] on 9/10 with a couple of weeks history of progressive lower extremity edema and dyspnea on mild exertion.  He has had low-grade fevers but no cough.  Evaluation at [facility] showed CTA negative for PE, loculated left pleural effusion concerning for empyema.  As per family, lower extremity Dopplers a week ago at ALF reportedly negative for DVT.  Lasix dose recently increased as outpatient.  Patient sees pulmonologist Dr. Blenda Nicelyhodri who evaluated him at [facility] and recommended admission to a tertiary cancer center for evaluation by CTS surgery, consideration for VATS.  Patient was thereby [facility].   Assessment & Plan:   Principal Problem:   Empyema (HCC) Active Problems:   CHF (congestive heart failure) (HCC)   OSA (obstructive sleep apnea)   Dyspnea   Acute diastolic heart failure (HCC)   Loculated left pleural effusion;  Empyema.  Prior history of right side empyema.  Continue with IV antibiotics.  CVTS recommend IR Chest tube placement.  Underwent chest tube placement by IR 9-12.  Culture growing gram positive cocci. Follow results.  Chest x ray stable.  Follow CVTS recommendation for chest tube.   Acute in chronic diastolic Heart failure exacerbation.  Pulmonary stenosis;  ECHO diastolic dysfunction.  Cardiology following.  Cr up, change lasix to 40 mg BId and metolazone to 2.5 mg daily per cardio recommendation.   OSA;  Apparently recently  diagnosed and awaiting CPAP.  [place] in [place].  Morbid obesity/Body mass index is 41.87 kg/m.  Down syndrome: Mental status at baseline.  Anemia; follow trend.  Hb stable.   DVT prophylaxis: Lovenox Code Status; full code.  Family Communication: father at bedside.  Disposition Plan: home when stable.    Consultants:   CVTS  Cardiology    Procedures:   Chest tube  ECHO   Antimicrobials:   Zosyn    Subjective: Breathing better. Denies pain   Objective: Vitals:   01/31/18 1652 01/31/18 2322 02/01/18 0400 02/01/18 0902  BP: 104/84 116/65  115/89  Pulse: (!) 102 97  86  Resp:  17    Temp: 98.4 F (36.9 C) 98.7 F (37.1 C)  98.2 F (36.8 C)  TempSrc: Oral Oral  Oral  SpO2: 96% 93%  96%  Weight:   (!) 156.5 kg   Height:        Intake/Output Summary (Last 24 hours) at 02/01/2018 1245 Last data filed at 02/01/2018 1100 Gross per 24 hour  Intake 1959.71 ml  Output 2315 ml  Net -355.29 ml   Filed Weights   01/29/18 0635 01/30/18 0500 02/01/18 0400  Weight: 117.7 kg (!) 160 kg (!) 156.5 kg    Examination:  General exam: NAD Respiratory system: Crackles, right, chest tube in place.  Cardiovascular system: S 1, S 2 RRR Gastrointestinal system: BS present, soft, nt Central nervous system: Alert.  Extremities: Symmetric power.  Skin: Mild redness.    Data Reviewed: I have personally reviewed following labs and imaging studies  CBC: Recent Labs  Lab  01/29/18 0814 01/30/18 0440 01/31/18 0414 02/01/18 0729  WBC 6.2 5.6 6.3 7.9  HGB 11.0* 11.7* 12.4* 13.2  HCT 36.2* 39.2 41.1 42.8  MCV 96.3 97.0 95.1 95.3  PLT 369 408* 348 343   Basic Metabolic Panel: Recent Labs  Lab 01/29/18 0814 01/30/18 0440 01/31/18 0414 02/01/18 0729  NA 139 139 137 137  K 3.7 3.3* 3.2* 2.7*  CL 96* 97* 93* 91*  CO2 32 31 30 33*  GLUCOSE 146* 141* 143* 140*  BUN 12 14 16  22*  CREATININE 1.13 1.23 1.45* 1.50*  CALCIUM 7.9* 8.0* 8.1* 8.4*    GFR: Estimated Creatinine Clearance: 94.4 mL/min (A) (by C-G formula based on SCr of 1.5 mg/dL (H)). Liver Function Tests: Recent Labs  Lab 01/29/18 0814  AST 18  ALT 12  ALKPHOS 135*  BILITOT 0.5  PROT 6.4*  ALBUMIN 2.3*   No results for input(s): LIPASE, AMYLASE in the last 168 hours. No results for input(s): AMMONIA in the last 168 hours. Coagulation Profile: Recent Labs  Lab 01/30/18 0440  INR 1.33   Cardiac Enzymes: Recent Labs  Lab 01/29/18 0814 01/29/18 1029  TROPONINI <0.03 <0.03   BNP (last 3 results) No results for input(s): PROBNP in the last 8760 hours. HbA1C: No results for input(s): HGBA1C in the last 72 hours. CBG: No results for input(s): GLUCAP in the last 168 hours. Lipid Profile: No results for input(s): CHOL, HDL, LDLCALC, TRIG, CHOLHDL, LDLDIRECT in the last 72 hours. Thyroid Function Tests: Recent Labs    01/30/18 0440  TSH 5.145*   Anemia Panel: No results for input(s): VITAMINB12, FOLATE, FERRITIN, TIBC, IRON, RETICCTPCT in the last 72 hours. Sepsis Labs: No results for input(s): PROCALCITON, LATICACIDVEN in the last 168 hours.  Recent Results (from the past 240 hour(s))  MRSA PCR Screening     Status: Abnormal   Collection Time: 01/29/18  2:24 AM  Result Value Ref Range Status   MRSA by PCR POSITIVE (A) NEGATIVE Final    Comment:        The GeneXpert MRSA Assay (FDA approved for NASAL specimens only), is one component of a comprehensive MRSA colonization surveillance program. It is not intended to diagnose MRSA infection nor to guide or monitor treatment for MRSA infections. RESULT CALLED TO, READ BACK BY AND VERIFIED WITHLeta Baptist RN [phone] 01/29/18 A BROWNING Performed at [facility], [address] [place], [place] [address]   Surgical pcr screen     Status: None   Collection Time: 01/29/18  4:08 PM  Result Value Ref Range Status   MRSA, PCR NEGATIVE NEGATIVE Final   Staphylococcus aureus NEGATIVE NEGATIVE  Final    Comment: (NOTE) The Xpert SA Assay (FDA approved for NASAL specimens in patients 47 years of age and older), is one component of a comprehensive surveillance program. It is not intended to diagnose infection nor to guide or monitor treatment. Performed at [facility], [address], [place], [place] [address]   Aerobic/Anaerobic Culture (surgical/deep wound)     Status: None (Preliminary result)   Collection Time: 01/30/18  4:49 PM  Result Value Ref Range Status   Specimen Description PLEURAL  Final   Special Requests NONE  Final   Gram Stain   Final    ABUNDANT WBC PRESENT, PREDOMINANTLY PMN MODERATE GRAM POSITIVE COCCI    Culture   Final    NO GROWTH 2 DAYS NO ANAEROBES ISOLATED; CULTURE IN PROGRESS FOR 5 DAYS Performed at  [facility], [address], [place], [place] [address]    Report Status PENDING  Incomplete         Radiology Studies: Dg Chest 2 View  Result Date: 01/31/2018 CLINICAL DATA:  Shortness of breath EXAM: CHEST - 2 VIEW COMPARISON:  01/29/18 FINDINGS: Cardiac shadow is prominent and accentuated by the frontal technique. The overall inspiratory effort is poor. There is a new drainage catheter in the left pleural space inferiorly with significant reduction in pleural effusion. Some left basilar atelectasis remains. No bony abnormality is seen. IMPRESSION: Reduction in left pleural effusion following chest tube placement. Mild left basilar atelectasis remains. Electronically Signed   By: Alcide Clever M.D.   On: 01/31/2018 10:03   Ct Image Guided Drainage By Percutaneous Catheter  Result Date: 01/30/2018 CLINICAL DATA:  Left sub pulmonic empyema EXAM: CT GUIDED LEFT PLEURAL DRAIN PLACEMENT ANESTHESIA/SEDATION: Intravenous Fentanyl and Versed were administered as conscious sedation during continuous monitoring of the patient's level of consciousness and physiological / cardiorespiratory status by the radiology RN, with a total moderate sedation  time of 14 minutes. PROCEDURE: The procedure, risks, benefits, and alternatives were explained to the patient and parent. Questions regarding the procedure were encouraged and answered. The parent understands and consents to the procedure. Patient placed right lateral decubitus. Limited axial scans through the thorax obtained. The left sub pulmonic collection was localized and an appropriate skin entry site was determined and marked. The operative field was prepped with chlorhexidinein a sterile fashion, and a sterile drape was applied covering the operative field. A sterile gown and sterile gloves were used for the procedure. Local anesthesia was provided with 1% Lidocaine. Under CT fluoroscopic guidance, a 19 gauge percutaneous entry needle was advanced into the sub pulmonic collection. Greenish purulent material could be aspirated. An Amplatz guidewire advanced easily within the collection, its position confirmed on CT. Tract dilated to facilitate placement of a 12 French pigtail drain catheter, formed centrally within the collection. CT confirms appropriate positioning. Catheter secured externally with a externally with 0 Prolene suture and StatLock and placed a Pleur-evac drainage. 20 mL aspirate was sent for Gram stain and culture. The patient tolerated the procedure well. COMPLICATIONS: None immediate FINDINGS: Left loculated sub pulmonic collection was again localized. 12 French pigtail drain catheter placed. 20 mL of the purulent aspirate was sent for Gram stain and culture. IMPRESSION: 1. Technically successful left pleural drain catheter placement under CT guidance. Electronically Signed   By: Corlis Leak M.D.   On: 01/30/2018 17:02        Scheduled Meds: . Chlorhexidine Gluconate Cloth  6 each Topical Q0600  . enoxaparin (LOVENOX) injection  80 mg Subcutaneous Q24H  . [START ON 02/02/2018] furosemide  40 mg Oral BID  . [START ON 02/02/2018] metolazone  2.5 mg Oral Daily  . mupirocin ointment  1  application Nasal BID  . potassium chloride  40 mEq Oral BID  . sodium chloride flush  5 mL Intracatheter Q8H   Continuous Infusions: . sodium chloride 10 mL/hr at 01/30/18 1351  . piperacillin-tazobactam (ZOSYN)  IV 3.375 g (02/01/18 1147)  . potassium chloride 10 mEq (02/01/18 1145)  . vancomycin 1,500 mg (02/01/18 0925)     LOS: 4 days    Time spent: 35 minutes.     Alba Cory, MD [facility] Pager [phone]  If 7PM-7AM, please contact night-coverage [email or web address] Password [facility] 02/01/2018, 12:45 PM

## 2018-02-01 NOTE — Progress Notes (Signed)
Progress Note  Patient Name: Joshua Reilly Date of Encounter: 02/01/2018  Primary Cardiologist: Tobias AlexanderKatarina Tajha Sammarco, MD   Subjective   He is feeling better, improved SOB.  Inpatient Medications    Scheduled Meds: . Chlorhexidine Gluconate Cloth  6 each Topical Q0600  . enoxaparin (LOVENOX) injection  80 mg Subcutaneous Q24H  . furosemide  40 mg Intravenous Q12H  . metolazone  5 mg Oral Daily  . mupirocin ointment  1 application Nasal BID  . sodium chloride flush  5 mL Intracatheter Q8H   Continuous Infusions: . sodium chloride 10 mL/hr at 01/30/18 1351  . piperacillin-tazobactam (ZOSYN)  IV 3.375 g (02/01/18 0420)  . vancomycin 1,500 mg (02/01/18 0044)   PRN Meds: sodium chloride, acetaminophen **OR** acetaminophen, ondansetron **OR** ondansetron (ZOFRAN) IV   Vital Signs    Vitals:   01/30/18 2328 01/31/18 1652 01/31/18 2322 02/01/18 0400  BP: 114/64 104/84 116/65   Pulse: 90 (!) 102 97   Resp: 18  17   Temp: 98.8 F (37.1 C) 98.4 F (36.9 C) 98.7 F (37.1 C)   TempSrc: Oral Oral Oral   SpO2: 93% 96% 93%   Weight:    (!) 156.5 kg  Height:        Intake/Output Summary (Last 24 hours) at 02/01/2018 0626 Last data filed at 01/31/2018 2048 Gross per 24 hour  Intake 620 ml  Output 1115 ml  Net -495 ml   Filed Weights   01/29/18 0635 01/30/18 0500 02/01/18 0400  Weight: 117.7 kg (!) 160 kg (!) 156.5 kg    Telemetry    SR - Personally Reviewed  Physical Exam  In bed, laying flat GEN: No acute distress.   Neck: No JVD Cardiac: RRR, 2/6 systolic murmurs, rubs, or gallops.  Respiratory: decreased BS up to the mid left lung, drain in the left base GI: Soft, nontender, non-distended  MS: 1+ B/L LE edema with decreased erythema Neuro:  Nonfocal  Psych: flat affect   Labs    Chemistry Recent Labs  Lab 01/29/18 0814 01/30/18 0440 01/31/18 0414  NA 139 139 137  K 3.7 3.3* 3.2*  CL 96* 97* 93*  CO2 32 31 30  GLUCOSE 146* 141* 143*  BUN 12 14 16     CREATININE 1.13 1.23 1.45*  CALCIUM 7.9* 8.0* 8.1*  PROT 6.4*  --   --   ALBUMIN 2.3*  --   --   AST 18  --   --   ALT 12  --   --   ALKPHOS 135*  --   --   BILITOT 0.5  --   --   GFRNONAA >60 >60 59*  GFRAA >60 >60 >60  ANIONGAP 11 11 14      Hematology Recent Labs  Lab 01/29/18 0814 01/30/18 0440 01/31/18 0414  WBC 6.2 5.6 6.3  RBC 3.76* 4.04* 4.32  HGB 11.0* 11.7* 12.4*  HCT 36.2* 39.2 41.1  MCV 96.3 97.0 95.1  MCH 29.3 29.0 28.7  MCHC 30.4 29.8* 30.2  RDW 16.2* 16.6* 16.4*  PLT 369 408* 348    Cardiac Enzymes Recent Labs  Lab 01/29/18 0814 01/29/18 1029  TROPONINI <0.03 <0.03   No results for input(s): TROPIPOC in the last 168 hours.   BNP Recent Labs  Lab 01/29/18 0814  BNP 10.9     DDimer No results for input(s): DDIMER in the last 168 hours.   Radiology    Dg Chest 2 View  Result Date: 01/31/2018 CLINICAL DATA:  Shortness  of breath EXAM: CHEST - 2 VIEW COMPARISON:  01/29/18 FINDINGS: Cardiac shadow is prominent and accentuated by the frontal technique. The overall inspiratory effort is poor. There is a new drainage catheter in the left pleural space inferiorly with significant reduction in pleural effusion. Some left basilar atelectasis remains. No bony abnormality is seen. IMPRESSION: Reduction in left pleural effusion following chest tube placement. Mild left basilar atelectasis remains. Electronically Signed   By: Alcide Clever M.D.   On: 01/31/2018 10:03   Ct Image Guided Drainage By Percutaneous Catheter  Result Date: 01/30/2018 CLINICAL DATA:  Left sub pulmonic empyema EXAM: CT GUIDED LEFT PLEURAL DRAIN PLACEMENT ANESTHESIA/SEDATION: Intravenous Fentanyl and Versed were administered as conscious sedation during continuous monitoring of the patient's level of consciousness and physiological / cardiorespiratory status by the radiology RN, with a total moderate sedation time of 14 minutes. PROCEDURE: The procedure, risks, benefits, and alternatives were  explained to the patient and parent. Questions regarding the procedure were encouraged and answered. The parent understands and consents to the procedure. Patient placed right lateral decubitus. Limited axial scans through the thorax obtained. The left sub pulmonic collection was localized and an appropriate skin entry site was determined and marked. The operative field was prepped with chlorhexidinein a sterile fashion, and a sterile drape was applied covering the operative field. A sterile gown and sterile gloves were used for the procedure. Local anesthesia was provided with 1% Lidocaine. Under CT fluoroscopic guidance, a 19 gauge percutaneous entry needle was advanced into the sub pulmonic collection. Greenish purulent material could be aspirated. An Amplatz guidewire advanced easily within the collection, its position confirmed on CT. Tract dilated to facilitate placement of a 12 French pigtail drain catheter, formed centrally within the collection. CT confirms appropriate positioning. Catheter secured externally with a externally with 0 Prolene suture and StatLock and placed a Pleur-evac drainage. 20 mL aspirate was sent for Gram stain and culture. The patient tolerated the procedure well. COMPLICATIONS: None immediate FINDINGS: Left loculated sub pulmonic collection was again localized. 12 French pigtail drain catheter placed. 20 mL of the purulent aspirate was sent for Gram stain and culture. IMPRESSION: 1. Technically successful left pleural drain catheter placement under CT guidance. Electronically Signed   By: Corlis Leak M.D.   On: 01/30/2018 17:02    Cardiac Studies   Echo 01/30/2018  - Left ventricle: The cavity size was normal. Wall thickness was   normal. Systolic function was normal. The estimated ejection   fraction was in the range of 60% to 65%. Although no diagnostic   regional wall motion abnormality was identified, this possibility   cannot be completely excluded on the basis of this  study.   Features are consistent with a pseudonormal left ventricular   filling pattern, with concomitant abnormal relaxation and   increased filling pressure (grade 2 diastolic dysfunction). - Aortic valve: There was no stenosis. - Mitral valve: There was no significant regurgitation. - Right ventricle: The cavity size was normal. Systolic function   was normal. - Pulmonic valve: The findings are consistent with mild stenosis.   There was no regurgitation. Peak gradient (S): 23 mm Hg. - Pulmonary arteries: No complete TR doppler jet so unable to   estimate PA systolic pressure. - Inferior vena cava: The vessel was normal in size. The   respirophasic diameter changes were in the normal range (>= 50%),   consistent with normal central venous pressure.  Impressions:  - Normal LV size with EF 60-65%. Moderate diastolic  dysfunction.   Normal RV size and systolic function. Mild pulmonic stenosis with   peak gradient 23 mmHg.   Patient Profile     37 y.o. 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 As a child he was diagnosed with pulmonary stenosis that didn't cause any symptoms, he was very active enrolled in multiple sports. With age he gained weight, became less active, currently lives in assisted living facility. He underwent left pleural effusion thoracentesis in June - no further details on fluid studies. He developed DOE, LE edema about 5 days ago, presented to [facility] - with negative venous US for DVT. Chest CT negative for pulmonary embolism, and showed moderate left pleural effusion suspicious for empyema. Smaller when compared to the prior in June. The patient has had fever/chills few days ago, and productive cough.  Assessment & Plan    A/P Pneumonia with empyema Pulmonary stenosis  The patient underwent thoracentesis on 9/11 with removal of milky appearing fluid, drain left in place, overnight 500 cc, probably  not accurate because total weight down by 9 kg. Crea up yesterday, pending today. The patient has hypoalbuminemia. I have decreased lasix to 40 mg iv BID yesterday, if Crea further up, please switch to 40 mg PO BID and decrease metolazone to 2.5 mg po daily. Overall improved shortness of breath, significantly improved lower extremity edema now with decreased erythema and warmth.   Echocardiogram showed normal left and right systolic function, grade 2 diastolic dysfunction with evidence for elevated filling pressures, and only mild pulmonary stenosis. Per Dr. Alla German he is a poor candidate for VATS.  For questions or updates, please contact [facility] Please consult [email or web address] for contact info under     Signed, Tobias Alexander, MD  02/01/2018, 6:26 AM

## 2018-02-02 ENCOUNTER — Inpatient Hospital Stay (HOSPITAL_COMMUNITY): Payer: Medicare Other

## 2018-02-02 DIAGNOSIS — Z9689 Presence of other specified functional implants: Secondary | ICD-10-CM

## 2018-02-02 DIAGNOSIS — N183 Chronic kidney disease, stage 3 unspecified: Secondary | ICD-10-CM

## 2018-02-02 DIAGNOSIS — I370 Nonrheumatic pulmonary valve stenosis: Secondary | ICD-10-CM

## 2018-02-02 DIAGNOSIS — E876 Hypokalemia: Secondary | ICD-10-CM

## 2018-02-02 LAB — BASIC METABOLIC PANEL
Anion gap: 14 (ref 5–15)
BUN: 21 mg/dL — AB (ref 6–20)
CHLORIDE: 92 mmol/L — AB (ref 98–111)
CO2: 29 mmol/L (ref 22–32)
CREATININE: 1.44 mg/dL — AB (ref 0.61–1.24)
Calcium: 8.3 mg/dL — ABNORMAL LOW (ref 8.9–10.3)
GFR calc Af Amer: >60 mL/min (ref >60)
GFR calc non Af Amer: 60 mL/min — ABNORMAL LOW (ref >60)
GLUCOSE: 185 mg/dL — AB (ref 70–99)
Potassium: 3 mmol/L — ABNORMAL LOW (ref 3.5–5.1)
SODIUM: 135 mmol/L (ref 135–145)

## 2018-02-02 LAB — VANCOMYCIN, TROUGH: Vancomycin Tr: 41 ug/mL (ref 15–20)

## 2018-02-02 LAB — CBC
HCT: 41.6 % (ref 39.0–52.0)
HEMOGLOBIN: 12.7 g/dL — AB (ref 13.0–17.0)
MCH: 29.1 pg (ref 26.0–34.0)
MCHC: 30.5 g/dL (ref 30.0–36.0)
MCV: 95.2 fL (ref 78.0–100.0)
PLATELETS: 323 10*3/uL (ref 150–400)
RBC: 4.37 MIL/uL (ref 4.22–5.81)
RDW: 16.6 % — ABNORMAL HIGH (ref 11.5–15.5)
WBC: 5.1 10*3/uL (ref 4.0–10.5)

## 2018-02-02 MED ORDER — POTASSIUM CHLORIDE CRYS ER 20 MEQ PO TBCR
60.0000 meq | EXTENDED_RELEASE_TABLET | Freq: Once | ORAL | Status: AC
  Administered 2018-02-02: 60 meq via ORAL
  Filled 2018-02-02: qty 3

## 2018-02-02 NOTE — Care Management Important Message (Signed)
Important Message  Patient Details  Name: Joshua Reilly MRN: [id number] Date of Birth: Mar 02, 1981   Medicare Important Message Given:  Yes    Leone Havenaylor, Ajna Moors [place], RN 02/02/2018, 1:33 PM

## 2018-02-02 NOTE — Progress Notes (Signed)
Progress Note  Patient Name: Joshua Reilly Date of Encounter: 02/02/2018  Primary Cardiologist: Tobias Alexander, MD   Subjective   Breathing has improved. Legs less swollen. Without complaints this morning. Mother says his cough has subsided as well.  Inpatient Medications    Scheduled Meds: . Chlorhexidine Gluconate Cloth  6 each Topical Q0600  . enoxaparin (LOVENOX) injection  80 mg Subcutaneous Q24H  . furosemide  40 mg Oral BID  . metolazone  2.5 mg Oral Daily  . mupirocin ointment  1 application Nasal BID  . potassium chloride  40 mEq Oral BID  . sodium chloride flush  5 mL Intracatheter Q8H   Continuous Infusions: . sodium chloride 10 mL/hr at 01/30/18 1351  . piperacillin-tazobactam (ZOSYN)  IV Stopped (02/02/18 0929)   PRN Meds: sodium chloride, acetaminophen **OR** acetaminophen, ondansetron **OR** ondansetron (ZOFRAN) IV   Vital Signs    Vitals:   02/01/18 1654 02/01/18 2356 02/02/18 0500 02/02/18 0904  BP: 110/62 (!) 94/52  100/60  Pulse:  81  85  Resp:  (!) 22    Temp:  98.3 F (36.8 C)  98.5 F (36.9 C)  TempSrc:  Oral  Oral  SpO2:  90%  95%  Weight:   (!) 153.4 kg   Height:        Intake/Output Summary (Last 24 hours) at 02/02/2018 1228 Last data filed at 02/02/2018 0602 Gross per 24 hour  Intake 2243.87 ml  Output 2030 ml  Net 213.87 ml   Filed Weights   01/30/18 0500 02/01/18 0400 02/02/18 0500  Weight: (!) 160 kg (!) 156.5 kg (!) 153.4 kg    Telemetry     SR- Personally Reviewed   Physical Exam   GEN: No acute distress.   Neck: No JVD Cardiac: RRR, 2/6 systolic murmur over LUSB, rubs, or gallops.  Respiratory: Decreased sounds at left base. GI: Soft, nontender, non-distended  MS: No edema; No deformity. Neuro:  Nonfocal  Psych: Normal affect   Labs    Chemistry Recent Labs  Lab 01/29/18 0814  01/31/18 0414 02/01/18 0729 02/02/18 0907  NA 139   < > 137 137 135  K 3.7   < > 3.2* 2.7* 3.0*  CL 96*   < > 93* 91* 92*  CO2  32   < > 30 33* 29  GLUCOSE 146*   < > 143* 140* 185*  BUN 12   < > 16 22* 21*  CREATININE 1.13   < > 1.45* 1.50* 1.44*  CALCIUM 7.9*   < > 8.1* 8.4* 8.3*  PROT 6.4*  --   --   --   --   ALBUMIN 2.3*  --   --   --   --   AST 18  --   --   --   --   ALT 12  --   --   --   --   ALKPHOS 135*  --   --   --   --   BILITOT 0.5  --   --   --   --   GFRNONAA >60   < > 59* 57* 60*  GFRAA >60   < > >60 >60 >60  ANIONGAP 11   < > 14 13 14    < > = values in this interval not displayed.     Hematology Recent Labs  Lab 01/31/18 0414 02/01/18 0729 02/02/18 0907  WBC 6.3 7.9 5.1  RBC 4.32 4.49 4.37  HGB 12.4* 13.2  12.7*  HCT 41.1 42.8 41.6  MCV 95.1 95.3 95.2  MCH 28.7 29.4 29.1  MCHC 30.2 30.8 30.5  RDW 16.4* 16.7* 16.6*  PLT 348 343 323    Cardiac Enzymes Recent Labs  Lab 01/29/18 0814 01/29/18 1029  TROPONINI <0.03 <0.03   No results for input(s): TROPIPOC in the last 168 hours.   BNP Recent Labs  Lab 01/29/18 0814  BNP 10.9     DDimer No results for input(s): DDIMER in the last 168 hours.   Radiology    Dg Chest 2 View  Result Date: 02/02/2018 CLINICAL DATA:  Chest tube in place EXAM: CHEST - 2 VIEW COMPARISON:  Two days ago FINDINGS: Stable chest tube positioning at the left base where there is opacity from a fluid collection by CT. Low volumes. No visible pneumothorax. IMPRESSION: Unchanged pleural based opacity at the left base with superimposed percutaneous catheter. Electronically Signed   By: Marnee SpringJonathon  Watts M.D.   On: 02/02/2018 09:16    Cardiac Studies   Echo 01/30/2018  - Left ventricle: The cavity size was normal. Wall thickness was normal. Systolic function was normal. The estimated ejection fraction was in the range of 60% to 65%. Although no diagnostic regional wall motion abnormality was identified, this possibility cannot be completely excluded on the basis of this study. Features are consistent with a pseudonormal left  ventricular filling pattern, with concomitant abnormal relaxation and increased filling pressure (grade 2 diastolic dysfunction). - Aortic valve: There was no stenosis. - Mitral valve: There was no significant regurgitation. - Right ventricle: The cavity size was normal. Systolic function was normal. - Pulmonic valve: The findings are consistent with mild stenosis. There was no regurgitation. Peak gradient (S): 23 mm Hg. - Pulmonary arteries: No complete TR doppler jet so unable to estimate PA systolic pressure. - Inferior vena cava: The vessel was normal in size. The respirophasic diameter changes were in the normal range (>= 50%), consistent with normal central venous pressure.  Impressions:  - Normal LV size with EF 60-65%. Moderate diastolic dysfunction. Normal RV size and systolic function. Mild pulmonic stenosis with peak gradient 23 mmHg.  Patient Profile     37 y.o. male  with h/o Down Syndrome, who was adopted at age of 586 months. As a child he was diagnosed with pulmonary stenosis that didn't cause any symptoms, he was very active enrolled in multiple sports. With age he gained weight, became less active, currently lives in assisted living facility. He underwent left pleural effusion thoracentesis in June - no further details on fluid studies. He developed DOE, LE edema about 5 days ago, presented to [facility] - with negative venous US for DVT. Chest CT negative for pulmonary embolism, and showed moderate left pleural effusion suspicious for empyema. Smaller when compared to the prior in June. The patient has had fever/chills few days ago, and productive cough.  Assessment & Plan    1. Acute diastolic heart failure: Uncertain about I/O accuracy. Symptomatically, he has improved. Continue Lasix 40 mg bid and metolazone 2.5 mg daily for now. Metolazone can potentially be discontinued on 9/15 if volume remains stable. He is hypokalemic and on KCl. I will  give extra as K is 3.   2. Hypokalemia: K is 3. I will give extra KCl today in addition to what has already been prescribed.  3. Pulmonic stenosis: Stable.  4. Empyema with pneumonia: chest tube in place. On IV Zosyn.  5. CKD stage III: Creatinine down to 1.44 (  1.5 on 9/13). Now on oral diuretics.    For questions or updates, please contact [facility] Please consult [email or web address] for contact info under Cardiology/STEMI.      Signed, Prentice Docker, MD  02/02/2018, 12:28 PM

## 2018-02-02 NOTE — Progress Notes (Signed)
Procedure(s) (LRB): VIDEO ASSISTED THORACOSCOPY (VATS)/EMPYEMA (Left) Subjective: Patient improving with pigtail catheter and IV antibiotics Chest tube drainage has been decreasing daily and today is less than 30 cc. Cultures remain negative Chest x-ray shows improvement in left pulmonic loculated effusion It does not appear the patient will need a VATS Would leave chest tube in until scant drainage for 2 consecutive days [less than 30 cc/day] and continue IV antibiotics while patient in hospital. The patient's father does not feel that his living facility would be safe for him to be discharged with a pigtail catheter in place. Objective: Vital signs in last 24 hours: Temp:  [98.3 F (36.8 C)-99.2 F (37.3 C)] 98.5 F (36.9 C) (09/14 0904) Pulse Rate:  [81-85] 85 (09/14 0904) Cardiac Rhythm: Normal sinus rhythm (09/14 0742) Resp:  [19-22] 22 (09/13 2356) BP: (94-110)/(52-95) 100/60 (09/14 0904) SpO2:  [90 %-97 %] 95 % (09/14 0904) Weight:  [153.4 kg] 153.4 kg (09/14 0500)  Hemodynamic parameters for last 24 hours:  Afebrile  Intake/Output from previous day: 09/13 0701 - 09/14 0700 In: 3583.6 [P.O.:600; I.V.:96.8; IV Piggyback:2886.8] Out: 2630 [Urine:2500; Drains:130] Intake/Output this shift: No intake/output data recorded.  Exam Patient generally improved Improved breath sounds left base Improved peripheral edema   Lab Results: Recent Labs    02/01/18 0729 02/02/18 0907  WBC 7.9 5.1  HGB 13.2 12.7*  HCT 42.8 41.6  PLT 343 323   BMET:  Recent Labs    02/01/18 0729 02/02/18 0907  NA 137 135  K 2.7* 3.0*  CL 91* 92*  CO2 33* 29  GLUCOSE 140* 185*  BUN 22* 21*  CREATININE 1.50* 1.44*  CALCIUM 8.4* 8.3*    PT/INR: No results for input(s): LABPROT, INR in the last 72 hours. ABG    Component Value Date/Time   PHART 7.441 01/30/2018 0530   HCO3 33.3 (H) 01/30/2018 0530   O2SAT 90.0 01/30/2018 0530   CBG (last 3)  No results for input(s): GLUCAP in  the last 72 hours.  Assessment/Plan: S/P Procedure(s) (LRB): VIDEO ASSISTED THORACOSCOPY (VATS)/EMPYEMA (Left) Leave pigtail catheter in for now Continue IV antibiotics and Lasix Patient will need to remain in hospital until pigtail catheter removed which should occur by early to mid next week.  Will follow.  LOS: 5 days    Kathlee Nationseter Van Trigt III 02/02/2018

## 2018-02-02 NOTE — Progress Notes (Signed)
Pharmacy Antibiotic Note  Joshua Reilly is a 37 y.o. male admitted on 01/28/2018, now with concern for pneumonia/empyema.  Pharmacy has been consulted for vancomycin dosing.  Vancomycin trough supratherapeutic at 41. Goal trough 15-20  Plan: Hold vancomycin  Will check random level at 2100 and will redose  Monitor renal function and clinical status   Height: 5\' 6"  (167.6 cm) Weight: (!) 338 lb 3 oz (153.4 kg) IBW/kg (Calculated) : 63.8  Temp (24hrs), Avg:98.7 F (37.1 C), Min:98.3 F (36.8 C), Max:99.2 F (37.3 C)  Recent Labs  Lab 01/29/18 0814 01/30/18 0440 01/31/18 0414 02/01/18 0729 02/02/18 0906 02/02/18 0907  WBC 6.2 5.6 6.3 7.9  --  5.1  CREATININE 1.13 1.23 1.45* 1.50*  --  1.44*  VANCOTROUGH  --   --   --   --  41*  --     Estimated Creatinine Clearance: 97 mL/min (A) (by C-G formula based on SCr of 1.44 mg/dL (H)).    Antibiotics this admission: Zosyn 9/10 >> Vanc 9/12 >>    Cultures this admission:  9/10 MRSA PCR positive  9/11 Pleural Eff >> mod GPC 9/11 Bcx >> NGTD   Amanda PeaAnna Brentley Horrell, Pharm D PGY1 Pharmacy Resident  Phone [phone] Please use AMION for clinical pharmacists numbers  02/02/2018      12:08 PM

## 2018-02-02 NOTE — Clinical Social Work Note (Signed)
CSW continues to follow for discharge needs.  Lenon Kuennen, CSW [phone]  

## 2018-02-02 NOTE — Progress Notes (Signed)
CRITICAL VALUE ALERT  Critical Value:  Vancomycin,Trough (41)  Date & Time Notied:  02/02/18 at 1028  Provider Notified: Amanda PeaAnna Love  Pharmacist notify at 1055  Orders Received/Actions taken: none

## 2018-02-02 NOTE — Progress Notes (Signed)
PROGRESS NOTE    Joshua Reilly  JOI:[id number] DOB: 23-Feb-1981 DOA: 01/28/2018 PCP: Raelyn Number, MD    Brief Narrative:37 year old male, resident of an ALF, PMH of Down syndrome, pulmonary stenosis, chronic CHF, OSA awaiting CPAP, not on home oxygen, abdominal hernia, as per history provided by father, patient was hospitalized sometime in 2018 and had 2 chest tubes placed on the right side which drained thick fluid, underwent left-sided thoracentesis in June at [facility], now transferred from [facility] on 9/10 with a couple of weeks history of progressive lower extremity edema and dyspnea on mild exertion.  He has had low-grade fevers but no cough.  Evaluation at [facility] showed CTA negative for PE, loculated left pleural effusion concerning for empyema.  As per family, lower extremity Dopplers a week ago at ALF reportedly negative for DVT.  Lasix dose recently increased as outpatient.  Patient sees pulmonologist Dr. Alcide Clever who evaluated him at [facility] ED and recommended admission to a tertiary cancer center for evaluation by CTS surgery, consideration for VATS.  Patient was thereby transferred to [facility].   Assessment & Plan:   Principal Problem:   Empyema ([place]) Active Problems:   CHF (congestive heart failure) (HCC)   OSA (obstructive sleep apnea)   Dyspnea   Acute diastolic heart failure (HCC)   Loculated left pleural effusion;  Empyema.  Prior history of right side empyema.  Continue with IV antibiotics. Vancomycin and Zosyn.  CVTS recommend IR Chest tube placement.  Underwent chest tube placement by IR 9-12.  Culture Gram stain: growing gram positive cocci. Follow results. No growth 2 days.  Chest x ray stable. Today similar than yesterday.  Follow CVTS recommendation for chest tube.   Acute in chronic diastolic Heart failure exacerbation.  Pulmonary stenosis;  ECHO diastolic dysfunction.  Cardiology following.  Cr up, change lasix to 40 mg BID and  metolazone to 2.5 mg daily per cardio recommendation.  Urine out put yesterday 2.5L.  Awaing B-met for the morning.   OSA;  Apparently recently diagnosed and awaiting CPAP.  Follows with pulmonology in [place].  Morbid obesity/Body mass index is 41.87 kg/m.  Down syndrome: Mental status at baseline.  Anemia; follow trend.  Hb stable.   DVT prophylaxis: Lovenox Code Status; full code.  Family Communication: step mother at bedside.  Disposition Plan: home when stable.    Consultants:   CVTS  Cardiology    Procedures:   Chest tube  ECHO   Antimicrobials:   Zosyn    Subjective: He is alert, denies dyspnea, chest pain.   Objective: Vitals:   02/01/18 1654 02/01/18 2356 02/02/18 0500 02/02/18 0904  BP: 110/62 (!) 94/52  100/60  Pulse:  81  85  Resp:  (!) 22    Temp:  98.3 F (36.8 C)  98.5 F (36.9 C)  TempSrc:  Oral  Oral  SpO2:  90%  95%  Weight:   (!) 153.4 kg   Height:        Intake/Output Summary (Last 24 hours) at 02/02/2018 1014 Last data filed at 02/02/2018 0602 Gross per 24 hour  Intake 2243.87 ml  Output 2330 ml  Net -86.13 ml   Filed Weights   01/30/18 0500 02/01/18 0400 02/02/18 0500  Weight: (!) 160 kg (!) 156.5 kg (!) 153.4 kg    Examination:  General exam: NAD Respiratory system: Chest tube in place, CTA Cardiovascular system: S 1, S 2 RRR Gastrointestinal system: BS present, soft, nt Central nervous system:  Alert.  Extremities: no edema Skin: Mild redness.    Data Reviewed: I have personally reviewed following labs and imaging studies  CBC: Recent Labs  Lab 01/29/18 0814 01/30/18 0440 01/31/18 0414 02/01/18 0729 02/02/18 0907  WBC 6.2 5.6 6.3 7.9 5.1  HGB 11.0* 11.7* 12.4* 13.2 12.7*  HCT 36.2* 39.2 41.1 42.8 41.6  MCV 96.3 97.0 95.1 95.3 95.2  PLT 369 408* 348 343 953   Basic Metabolic Panel: Recent Labs  Lab 01/29/18 0814 01/30/18 0440 01/31/18 0414 02/01/18 0729  NA 139 139 137 137  K 3.7 3.3* 3.2*  2.7*  CL 96* 97* 93* 91*  CO2 32 31 30 33*  GLUCOSE 146* 141* 143* 140*  BUN _0 22*  CREATININE 1.13 1.23 1.45* 1.50*  CALCIUM 7.9* 8.0* 8.1* 8.4*   GFR: Estimated Creatinine Clearance: 93.1 mL/min (A) (by C-G formula based on SCr of 1.5 mg/dL (H)). Liver Function Tests: Recent Labs  Lab 01/29/18 0814  AST 18  ALT 12  ALKPHOS 135*  BILITOT 0.5  PROT 6.4*  ALBUMIN 2.3*   No results for input(s): LIPASE, AMYLASE in the last 168 hours. No results for input(s): AMMONIA in the last 168 hours. Coagulation Profile: Recent Labs  Lab 01/30/18 0440  INR 1.33   Cardiac Enzymes: Recent Labs  Lab 01/29/18 0814 01/29/18 1029  TROPONINI <0.03 <0.03   BNP (last 3 results) No results for input(s): PROBNP in the last 8760 hours. HbA1C: No results for input(s): HGBA1C in the last 72 hours. CBG: No results for input(s): GLUCAP in the last 168 hours. Lipid Profile: No results for input(s): CHOL, HDL, LDLCALC, TRIG, CHOLHDL, LDLDIRECT in the last 72 hours. Thyroid Function Tests: No results for input(s): TSH, T4TOTAL, FREET4, T3FREE, THYROIDAB in the last 72 hours. Anemia Panel: No results for input(s): VITAMINB12, FOLATE, FERRITIN, TIBC, IRON, RETICCTPCT in the last 72 hours. Sepsis Labs: No results for input(s): PROCALCITON, LATICACIDVEN in the last 168 hours.  Recent Results (from the past 240 hour(s))  MRSA PCR Screening     Status: Abnormal   Collection Time: 01/29/18  2:24 AM  Result Value Ref Range Status   MRSA by PCR POSITIVE (A) NEGATIVE Final    Comment:        The GeneXpert MRSA Assay (FDA approved for NASAL specimens only), is one component of a comprehensive MRSA colonization surveillance program. It is not intended to diagnose MRSA infection nor to guide or monitor treatment for MRSA infections. RESULT CALLED TO, READ BACK BY AND VERIFIED WITHShade Flood RN [phone] 01/29/18 A BROWNING Performed at [facility], [place] [address], [place], [place]  [address]   Surgical pcr screen     Status: None   Collection Time: 01/29/18  4:08 PM  Result Value Ref Range Status   MRSA, PCR NEGATIVE NEGATIVE Final   Staphylococcus aureus NEGATIVE NEGATIVE Final    Comment: (NOTE) The Xpert SA Assay (FDA approved for NASAL specimens in patients 17 years of age and older), is one component of a comprehensive surveillance program. It is not intended to diagnose infection nor to guide or monitor treatment. Performed at [facility], [place] [address] [place], [place] [address]   Aerobic/Anaerobic Culture (surgical/deep wound)     Status: None (Preliminary result)   Collection Time: 01/30/18  4:49 PM  Result Value Ref Range Status   Specimen Description PLEURAL  Final   Special Requests NONE  Final   Gram Stain   Final  ABUNDANT WBC PRESENT, PREDOMINANTLY PMN MODERATE GRAM POSITIVE COCCI    Culture   Final    NO GROWTH 2 DAYS NO ANAEROBES ISOLATED; CULTURE IN PROGRESS FOR 5 DAYS Performed at [facility] [address], [place], [place] [address]    Report Status PENDING  Incomplete         Radiology Studies: Dg Chest 2 View  Result Date: 02/02/2018 CLINICAL DATA:  Chest tube in place EXAM: CHEST - 2 VIEW COMPARISON:  Two days ago FINDINGS: Stable chest tube positioning at the left base where there is opacity from a fluid collection by CT. Low volumes. No visible pneumothorax. IMPRESSION: Unchanged pleural based opacity at the left base with superimposed percutaneous catheter. Electronically Signed   By: Monte Fantasia M.D.   On: 02/02/2018 09:16        Scheduled Meds: . Chlorhexidine Gluconate Cloth  6 each Topical Q0600  . enoxaparin (LOVENOX) injection  80 mg Subcutaneous Q24H  . furosemide  40 mg Oral BID  . metolazone  2.5 mg Oral Daily  . mupirocin ointment  1 application Nasal BID  . potassium chloride  40 mEq Oral BID  . sodium chloride flush  5 mL Intracatheter Q8H   Continuous Infusions: . sodium chloride  10 mL/hr at 01/30/18 1351  . piperacillin-tazobactam (ZOSYN)  IV Stopped (02/02/18 0929)  . vancomycin 1,500 mg (02/02/18 0932)     LOS: 5 days    Time spent: 35 minutes.     Elmarie Shiley, MD [facility] Pager [phone]  If 7PM-7AM, please contact night-coverage [email or web address] Password [email or web address] 02/02/2018, 10:14 AM

## 2018-02-03 LAB — BASIC METABOLIC PANEL
Anion gap: 16 — ABNORMAL HIGH (ref 5–15)
BUN: 24 mg/dL — AB (ref 6–20)
CALCIUM: 8.7 mg/dL — AB (ref 8.9–10.3)
CHLORIDE: 93 mmol/L — AB (ref 98–111)
CO2: 28 mmol/L (ref 22–32)
Creatinine, Ser: 1.49 mg/dL — ABNORMAL HIGH (ref 0.61–1.24)
GFR, EST NON AFRICAN AMERICAN: 58 mL/min — AB (ref >60)
Glucose, Bld: 138 mg/dL — ABNORMAL HIGH (ref 70–99)
POTASSIUM: 2.8 mmol/L — AB (ref 3.5–5.1)
SODIUM: 137 mmol/L (ref 135–145)

## 2018-02-03 LAB — VANCOMYCIN, RANDOM: Vancomycin Rm: 19

## 2018-02-03 MED ORDER — POTASSIUM CHLORIDE CRYS ER 20 MEQ PO TBCR
40.0000 meq | EXTENDED_RELEASE_TABLET | Freq: Once | ORAL | Status: AC
  Administered 2018-02-03: 40 meq via ORAL
  Filled 2018-02-03: qty 2

## 2018-02-03 MED ORDER — POTASSIUM CHLORIDE 10 MEQ/100ML IV SOLN
10.0000 meq | INTRAVENOUS | Status: AC
  Administered 2018-02-03 (×3): 10 meq via INTRAVENOUS
  Filled 2018-02-03 (×3): qty 100

## 2018-02-03 MED ORDER — LEVOTHYROXINE SODIUM 75 MCG PO TABS
75.0000 ug | ORAL_TABLET | Freq: Every day | ORAL | Status: DC
  Administered 2018-02-03 – 2018-02-07 (×5): 75 ug via ORAL
  Filled 2018-02-03 (×5): qty 1

## 2018-02-03 MED ORDER — VENLAFAXINE HCL ER 75 MG PO CP24
75.0000 mg | ORAL_CAPSULE | Freq: Every day | ORAL | Status: DC
  Administered 2018-02-03 – 2018-02-07 (×5): 75 mg via ORAL
  Filled 2018-02-03 (×5): qty 1

## 2018-02-03 MED ORDER — RIVASTIGMINE TARTRATE 1.5 MG PO CAPS
4.5000 mg | ORAL_CAPSULE | Freq: Every day | ORAL | Status: DC
  Administered 2018-02-03 – 2018-02-06 (×4): 4.5 mg via ORAL
  Filled 2018-02-03 (×4): qty 3

## 2018-02-03 MED ORDER — VANCOMYCIN HCL 10 G IV SOLR
1250.0000 mg | Freq: Two times a day (BID) | INTRAVENOUS | Status: DC
  Administered 2018-02-03 – 2018-02-06 (×6): 1250 mg via INTRAVENOUS
  Filled 2018-02-03 (×7): qty 1250

## 2018-02-03 MED ORDER — LAMOTRIGINE 25 MG PO TABS
100.0000 mg | ORAL_TABLET | Freq: Every day | ORAL | Status: DC
  Administered 2018-02-03 – 2018-02-06 (×4): 100 mg via ORAL
  Filled 2018-02-03 (×4): qty 4

## 2018-02-03 NOTE — Progress Notes (Signed)
Progress Note  Patient Name: Joshua Reilly Date of Encounter: 02/03/2018  Primary Cardiologist: Tobias Alexander, MD   Subjective   He is doing well this morning. He asked about having Diet Mountain Dew at home but his father told him he couldn't drink sodas anymore. Says the food here is good but it isn't making him full. He denies shortness of breath.  Inpatient Medications    Scheduled Meds: . enoxaparin (LOVENOX) injection  80 mg Subcutaneous Q24H  . furosemide  40 mg Oral BID  . lamoTRIgine  100 mg Oral QHS  . levothyroxine  75 mcg Oral QAC breakfast  . metolazone  2.5 mg Oral Daily  . potassium chloride  40 mEq Oral BID  . potassium chloride  40 mEq Oral Once  . rivastigmine  4.5 mg Oral QHS  . sodium chloride flush  5 mL Intracatheter Q8H  . venlafaxine XR  75 mg Oral Q breakfast   Continuous Infusions: . sodium chloride 10 mL/hr at 01/30/18 1351  . piperacillin-tazobactam (ZOSYN)  IV 3.375 g (02/03/18 0312)  . potassium chloride     PRN Meds: sodium chloride, acetaminophen **OR** acetaminophen, ondansetron **OR** ondansetron (ZOFRAN) IV   Vital Signs    Vitals:   02/02/18 0904 02/02/18 1804 02/02/18 2333 02/03/18 0819  BP: 100/60 103/71 117/60 (!) 105/54  Pulse: 85 69 80 79  Resp:  20 (!) 24 18  Temp: 98.5 F (36.9 C) 98.4 F (36.9 C) 98.6 F (37 C) 98.3 F (36.8 C)  TempSrc: Oral Oral Oral Oral  SpO2: 95% (!) 81% 94% 97%  Weight:      Height:        Intake/Output Summary (Last 24 hours) at 02/03/2018 0832 Last data filed at 02/03/2018 0523 Gross per 24 hour  Intake 160 ml  Output 602 ml  Net -442 ml   Filed Weights   01/30/18 0500 02/01/18 0400 02/02/18 0500  Weight: (!) 160 kg (!) 156.5 kg (!) 153.4 kg    Telemetry    Sinus rhythm with PVC's and ventricular bigeminy - Personally Reviewed  ECG    No new tracings - Personally Reviewed  Physical Exam   GEN: No acute distress.   Neck: No JVD Cardiac: RRR, 2/6 systolic murmur over LUSB, no  rubs, or gallops.  Respiratory: Slightly diminished breath sounds at left base. GI: Soft, nontender, non-distended  MS: Trivial bilateral leg edema; No deformity. Neuro:  Nonfocal  Psych: Normal affect   Labs    Chemistry Recent Labs  Lab 01/29/18 0814  02/01/18 0729 02/02/18 0907 02/03/18 0244  NA 139   < > 137 135 137  K 3.7   < > 2.7* 3.0* 2.8*  CL 96*   < > 91* 92* 93*  CO2 32   < > 33* 29 28  GLUCOSE 146*   < > 140* 185* 138*  BUN 12   < > 22* 21* 24*  CREATININE 1.13   < > 1.50* 1.44* 1.49*  CALCIUM 7.9*   < > 8.4* 8.3* 8.7*  PROT 6.4*  --   --   --   --   ALBUMIN 2.3*  --   --   --   --   AST 18  --   --   --   --   ALT 12  --   --   --   --   ALKPHOS 135*  --   --   --   --   BILITOT 0.5  --   --   --   --  GFRNONAA >60   < > 57* 60* 58*  GFRAA >60   < > >60 >60 >60  ANIONGAP 11   < > 13 14 16*   < > = values in this interval not displayed.     Hematology Recent Labs  Lab 01/31/18 0414 02/01/18 0729 02/02/18 0907  WBC 6.3 7.9 5.1  RBC 4.32 4.49 4.37  HGB 12.4* 13.2 12.7*  HCT 41.1 42.8 41.6  MCV 95.1 95.3 95.2  MCH 28.7 29.4 29.1  MCHC 30.2 30.8 30.5  RDW 16.4* 16.7* 16.6*  PLT 348 343 323    Cardiac Enzymes Recent Labs  Lab 01/29/18 0814 01/29/18 1029  TROPONINI <0.03 <0.03   No results for input(s): TROPIPOC in the last 168 hours.   BNP Recent Labs  Lab 01/29/18 0814  BNP 10.9     DDimer No results for input(s): DDIMER in the last 168 hours.   Radiology    Dg Chest 2 View  Result Date: 02/02/2018 CLINICAL DATA:  Chest tube in place EXAM: CHEST - 2 VIEW COMPARISON:  Two days ago FINDINGS: Stable chest tube positioning at the left base where there is opacity from a fluid collection by CT. Low volumes. No visible pneumothorax. IMPRESSION: Unchanged pleural based opacity at the left base with superimposed percutaneous catheter. Electronically Signed   By: Marnee Spring M.D.   On: 02/02/2018 09:16    Cardiac Studies   Echo  01/30/2018  - Left ventricle: The cavity size was normal. Wall thickness was normal. Systolic function was normal. The estimated ejection fraction was in the range of 60% to 65%. Although no diagnostic regional wall motion abnormality was identified, this possibility cannot be completely excluded on the basis of this study. Features are consistent with a pseudonormal left ventricular filling pattern, with concomitant abnormal relaxation and increased filling pressure (grade 2 diastolic dysfunction). - Aortic valve: There was no stenosis. - Mitral valve: There was no significant regurgitation. - Right ventricle: The cavity size was normal. Systolic function was normal. - Pulmonic valve: The findings are consistent with mild stenosis. There was no regurgitation. Peak gradient (S): 23 mm Hg. - Pulmonary arteries: No complete TR doppler jet so unable to estimate PA systolic pressure. - Inferior vena cava: The vessel was normal in size. The respirophasic diameter changes were in the normal range (>= 50%), consistent with normal central venous pressure.  Impressions:  - Normal LV size with EF 60-65%. Moderate diastolic dysfunction. Normal RV size and systolic function. Mild pulmonic stenosis with peak gradient 23 mmHg.  Patient Profile     37 y.o. male with h/o Down Syndrome, who was adopted at age of 18 months. As a child he was diagnosed with pulmonary stenosis that didn't cause any symptoms, he was very active enrolled in multiple sports. With age he gained weight, became less active, currently lives in assisted living facility. He underwent left pleural effusion thoracentesis in June - no further details on fluid studies. He developed DOE, LE edema about 5 days ago, presented to [facility] - with negative venous US for DVT. Chest CT negative for pulmonary embolism, and showed moderate left pleural effusion suspicious for empyema. Smaller when compared  to the prior in June. The patient has had fever/chills few days ago, and productive cough.  Assessment & Plan     1. Acute diastolic heart failure: Negative 442 cc in last 24 hrs. Symptomatically, he is stable. Continue Lasix 40 mg bid. I will stop metolazone 2.5 mg daily  for now. He remains hypokalemic and on KCl. Repletement is being given (likely from metolazone).   2. Hypokalemia: K is 2.8. He is being given extra KCl.  3. Pulmonic stenosis: Stable.  4. Empyema with pneumonia: chest tube in place. On IV Zosyn.  5. CKD stage III: Creatinine up to 1.49 (1.44 on 9/14). On oral diuretics. I am stopping metolazone.   For questions or updates, please contact [facility] Please consult [email or web address] for contact info under Cardiology/STEMI.      Signed, Prentice DockerSuresh Koneswaran, MD  02/03/2018, 8:32 AM

## 2018-02-03 NOTE — Progress Notes (Signed)
Pharmacy Antibiotic Note  Joshua Reilly is a 37 y.o. male admitted on 01/28/2018, now with concern for pneumonia/empyema.  Pharmacy has been consulted for vancomycin dosing.  Vancomycin trough was supratherapeutic at 41. Vancomycin was held 9/14. Vancomycin random level therapeutic at 19.  Goal trough 15-20  Plan: Start vancomycin 1250mg  q12 hours   Monitor renal function, clinical status, and trough at steady state.  Height: 5\' 6"  (167.6 cm) Weight: (!) 338 lb 3 oz (153.4 kg) IBW/kg (Calculated) : 63.8  Temp (24hrs), Avg:98.4 F (36.9 C), Min:98.3 F (36.8 C), Max:98.6 F (37 C)  Recent Labs  Lab 01/29/18 0814 01/30/18 0440 01/31/18 0414 02/01/18 0729 02/02/18 0906 02/02/18 0907 02/03/18 0244 02/03/18 1019  WBC 6.2 5.6 6.3 7.9  --  5.1  --   --   CREATININE 1.13 1.23 1.45* 1.50*  --  1.44* 1.49*  --   VANCOTROUGH  --   --   --   --  41*  --   --   --   VANCORANDOM  --   --   --   --   --   --   --  19    Estimated Creatinine Clearance: 93.8 mL/min (A) (by C-G formula based on SCr of 1.49 mg/dL (H)).    Antibiotics this admission: Zosyn 9/10 >> Vanc 9/12 >>   Dose changes this admission: Vancomycin 1500mg  IV every 8 hours decreased to 1250mg  q12 hours   9/14 Vanc trough=41 9/15 Vanc random = 19  Cultures this admission:  9/10 MRSA PCR positive  9/11 Pleural Eff >> mod GPC 9/11 Bcx >> NGTD   Joshua Reilly, Pharm D PGY1 Pharmacy Resident  Please use AMION for clinical pharmacists numbers  02/03/2018      12:56 PM

## 2018-02-03 NOTE — Evaluation (Signed)
Physical Therapy Evaluation Patient Details Name: Joshua Reilly MRN: [id number] DOB: June 14, 1980 Today's Date: 02/03/2018   History of Present Illness  Pt is a 37 y.o. M resident of an ALF with PMH of Down syndrome, pulmonary stenosis, chronic CHF, OSA awaiting CPAP, who was hospitalized in 2018 and had 2 chest tubes placed on right side, underwent left sided [facility]acentesis [facility], now transferred from [facility] on 9/10 with couple weeks history of progressive lower extremity edema and dyspnea. Presenting with loculated left pleural effusion and acute on chronic diastolic heart failure exacerbation   Clinical Impression  Patient evaluated by Physical Therapy with no further acute PT needs identified. Patient independent with ADL's and mobility at baseline and has excellent family support; resides at ALF. Currently ambulating 300 feet modified independently with no gross unsteadiness and slightly decreased gait speed. Provided pt and pt father with activity recommendations and encouraged future walking with nursing staff. All education has been completed and the patient has no further questions. No follow-up Physical Therapy or equipment needs. PT is signing off. Thank you for this referral.     Follow Up Recommendations No PT follow up    Equipment Recommendations  None recommended by PT    Recommendations for Other Services       Precautions / Restrictions Precautions Precautions: Fall Precaution Comments: chest tube Restrictions Weight Bearing Restrictions: No      Mobility  Bed Mobility Overal bed mobility: Modified Independent                Transfers Overall transfer level: Modified independent Equipment used: None                Ambulation/Gait Ambulation/Gait assistance: Modified independent (Device/Increase time) Gait Distance (Feet): 300 Feet Assistive device: IV Pole Gait Pattern/deviations: Step-through pattern;Wide base of  support;Decreased stride length Gait velocity: decr   General Gait Details: Patient with wide BOS utilized secondary to obesity. No overt LOB or gross unsteadiness noted  Stairs            Wheelchair Mobility    Modified Rankin (Stroke Patients Only)       Balance Overall balance assessment: Mild deficits observed, not formally tested                                           Pertinent Vitals/Pain Pain Assessment: No/denies pain    Home Living Family/patient expects to be discharged to:: Assisted living([facility])               Home Equipment: None      Prior Function Level of Independence: Independent         Comments: Independent with ADL's and mobility; walks to dining hall. enjoys watching football and playing video games     Hand Dominance        Extremity/Trunk Assessment   Upper Extremity Assessment Upper Extremity Assessment: Overall WFL for tasks assessed    Lower Extremity Assessment Lower Extremity Assessment: Overall WFL for tasks assessed       Communication   Communication: No difficulties  Cognition Arousal/Alertness: Awake/alert Behavior During Therapy: WFL for tasks assessed/performed Overall Cognitive Status: History of cognitive impairments - at baseline  General Comments: Patient at baseline cognitively with history of Down Syndrome. Very pleasant, oriented, and willing to participate in therapy.       General Comments General comments (skin integrity, edema, etc.): pt father present    Exercises     Assessment/Plan    PT Assessment Patent does not need any further PT services  PT Problem List         PT Treatment Interventions      PT Goals (Current goals can be found in the Care Plan section)  Acute Rehab PT Goals Patient Stated Goal: "watch TV." PT Goal Formulation: All assessment and education complete, DC therapy    Frequency      Barriers to discharge        Co-evaluation               AM-PAC PT "6 Clicks" Daily Activity  Outcome Measure Difficulty turning over in bed (including adjusting bedclothes, sheets and blankets)?: None Difficulty moving from lying on back to sitting on the side of the bed? : A Little Difficulty sitting down on and standing up from a chair with arms (e.g., wheelchair, bedside commode, etc,.)?: None Help needed moving to and from a bed to chair (including a wheelchair)?: None Help needed walking in hospital room?: None Help needed climbing 3-5 steps with a railing? : A Little 6 Click Score: 22    End of Session   Activity Tolerance: Patient tolerated treatment well Patient left: in bed;with call bell/phone within reach;with family/visitor present Nurse Communication: Mobility status PT Visit Diagnosis: Other abnormalities of gait and mobility (R26.89);Difficulty in walking, not elsewhere classified (R26.2)    Time: [id number] PT Time Calculation (min) (ACUTE ONLY): 18 min   Charges:   PT Evaluation $PT Eval Moderate Complexity: 1 Mod          Laurina Bustlearoline Trejan Buda, [place]PT, DPT Acute Rehabilitation Services Pager [phone] Office [phone]   Vanetta MuldersCarloine H Beverlie Kurihara 02/03/2018, 4:35 PM

## 2018-02-03 NOTE — Progress Notes (Signed)
PROGRESS NOTE    Joshua Reilly  ZOX:[id number] DOB: Dec 24, 1980 DOA: 01/28/2018 PCP: [name], Imran P, MD    Brief Narrative:37 year old male, resident of an ALF, PMH of Down syndrome, pulmonary stenosis, chronic CHF, OSA awaiting CPAP, not on home oxygen, abdominal hernia, as per history provided by father, patient was hospitalized sometime in 2018 and had 2 chest tubes placed on the right side which drained thick fluid, underwent left-sided [facility]acentesis in June at [facility], now transferred [facility] on 9/10 with a couple of weeks history of progressive lower extremity edema and dyspnea on mild exertion.  He has had low-grade fevers but no cough.  Evaluation at [facility] showed CTA negative for PE, loculated left pleural effusion concerning for empyema.  As per family, lower extremity Dopplers a week ago at ALF reportedly negative for DVT.  Lasix dose recently increased as outpatient.  Patient sees pulmonologist Dr. Blenda Nicelyhodri who evaluated [facility] at [facility] and recommended admission to a tertiary cancer center for evaluation by CTS surgery, consideration for VATS.  Patient was thereby [facility].   Assessment & Plan:   Principal Problem:   Empyema of left pleural space (HCC) Active Problems:   CHF (congestive heart failure) (HCC)   OSA (obstructive sleep apnea)   Dyspnea   Acute diastolic heart failure (HCC)   Chest tube in place   Hypokalemia   Pulmonary valve stenosis   CKD (chronic kidney disease), stage III (HCC)   Loculated left pleural effusion;  Empyema.  Prior history of right side empyema.  Continue with IV antibiotics. Vancomycin and Zosyn.  CVTS recommend IR Chest tube placement.  Underwent chest tube placement by IR 9-12.  Culture Gram stain: growing gram positive cocci. Follow results. No growth 2 days.  Chest x ray stable. Today similar than yesterday.  Follow CVTS recommendation for chest tube.  10 cc from chest tube since yesterday.    Continue with IV antibiotics.   Acute in chronic diastolic Heart failure exacerbation.  Pulmonary stenosis;  ECHO diastolic dysfunction.  Cardiology following.  Continue with lasix to 40 mg BID Urine out put yesterday 2.5L.  Holding metolazone.   Hypokalemia; re[plete orally   OSA;  Apparently recently diagnosed and awaiting CPAP.  [place].  Morbid obesity/Body mass index is 41.87 kg/m.  Down syndrome: Mental status at baseline.  Anemia; follow trend.  Hb stable.   DVT prophylaxis: Lovenox Code Status; full code.  Family Communication: step mother at bedside.  Disposition Plan: home when stable.    Consultants:   CVTS  Cardiology    Procedures:   Chest tube  ECHO   Antimicrobials:   Zosyn    Subjective: He is feeling well, denies pain  Objective: Vitals:   02/02/18 0904 02/02/18 1804 02/02/18 2333 02/03/18 0819  BP: 100/60 103/71 117/60 (!) 105/54  Pulse: 85 69 80 79  Resp:  20 (!) 24 18  Temp: 98.5 F (36.9 C) 98.4 F (36.9 C) 98.6 F (37 C) 98.3 F (36.8 C)  TempSrc: Oral Oral Oral Oral  SpO2: 95% (!) 81% 94% 97%  Weight:      Height:        Intake/Output Summary (Last 24 hours) at 02/03/2018 1204 Last data filed at 02/03/2018 0900 Gross per 24 hour  Intake 365 ml  Output 612 ml  Net -247 ml   Filed Weights   01/30/18 0500 02/01/18 0400 02/02/18 0500  Weight: (!) 160 kg (!) 156.5 kg (!) 153.4  kg    Examination:  General exam: NAD Respiratory system: Chest tube in place, CTA Cardiovascular system: S 1, S 2 RRR Gastrointestinal system: BS present, soft, nt Central nervous system:  Alert.  Extremities: no edema Skin: Mild redness.    Data Reviewed: I have personally reviewed following labs and imaging studies  CBC: Recent Labs  Lab 01/29/18 0814 01/30/18 0440 01/31/18 0414 02/01/18 0729 02/02/18 0907  WBC 6.2 5.6 6.3 7.9 5.1  HGB 11.0* 11.7* 12.4* 13.2 12.7*  HCT 36.2* 39.2 41.1 42.8  41.6  MCV 96.3 97.0 95.1 95.3 95.2  PLT 369 408* 348 343 323   Basic Metabolic Panel: Recent Labs  Lab 01/30/18 0440 01/31/18 0414 02/01/18 0729 02/02/18 0907 02/03/18 0244  NA 139 137 137 135 137  K 3.3* 3.2* 2.7* 3.0* 2.8*  CL 97* 93* 91* 92* 93*  CO2 31 30 33* 29 28  GLUCOSE 141* 143* 140* 185* 138*  BUN 14 16 22* 21* 24*  CREATININE 1.23 1.45* 1.50* 1.44* 1.49*  CALCIUM 8.0* 8.1* 8.4* 8.3* 8.7*   GFR: Estimated Creatinine Clearance: 93.8 mL/min (A) (by C-G formula based on SCr of 1.49 mg/dL (H)). Liver Function Tests: Recent Labs  Lab 01/29/18 0814  AST 18  ALT 12  ALKPHOS 135*  BILITOT 0.5  PROT 6.4*  ALBUMIN 2.3*   No results for input(s): LIPASE, AMYLASE in the last 168 hours. No results for input(s): AMMONIA in the last 168 hours. Coagulation Profile: Recent Labs  Lab 01/30/18 0440  INR 1.33   Cardiac Enzymes: Recent Labs  Lab 01/29/18 0814 01/29/18 1029  TROPONINI <0.03 <0.03   BNP (last 3 results) No results for input(s): PROBNP in the last 8760 hours. HbA1C: No results for input(s): HGBA1C in the last 72 hours. CBG: No results for input(s): GLUCAP in the last 168 hours. Lipid Profile: No results for input(s): CHOL, HDL, LDLCALC, TRIG, CHOLHDL, LDLDIRECT in the last 72 hours. Thyroid Function Tests: No results for input(s): TSH, T4TOTAL, FREET4, T3FREE, THYROIDAB in the last 72 hours. Anemia Panel: No results for input(s): VITAMINB12, FOLATE, FERRITIN, TIBC, IRON, RETICCTPCT in the last 72 hours. Sepsis Labs: No results for input(s): PROCALCITON, LATICACIDVEN in the last 168 hours.  Recent Results (from the past 240 hour(s))  MRSA PCR Screening     Status: Abnormal   Collection Time: 01/29/18  2:24 AM  Result Value Ref Range Status   MRSA by PCR POSITIVE (A) NEGATIVE Final    Comment:        The GeneXpert MRSA Assay (FDA approved for NASAL specimens only), is one component of a comprehensive MRSA colonization surveillance program. It  is not intended to diagnose MRSA infection nor to guide or monitor treatment for MRSA infections. RESULT CALLED TO, READ BACK BY AND VERIFIED WITHLeta Baptist RN [phone] 01/29/18 A BROWNING Performed at [facility], [address] [place], [place] [address]   Surgical pcr screen     Status: None   Collection Time: 01/29/18  4:08 PM  Result Value Ref Range Status   MRSA, PCR NEGATIVE NEGATIVE Final   Staphylococcus aureus NEGATIVE NEGATIVE Final    Comment: (NOTE) The Xpert SA Assay (FDA approved for NASAL specimens in patients 85 years of age and older), is one component of a comprehensive surveillance program. It is not intended to diagnose infection nor to guide or monitor treatment. Performed at [facility], [address] [place], [place] [address]   Aerobic/Anaerobic Culture (surgical/deep wound)  Status: None (Preliminary result)   Collection Time: 01/30/18  4:49 PM  Result Value Ref Range Status   Specimen Description PLEURAL  Final   Special Requests NONE  Final   Gram Stain   Final    ABUNDANT WBC PRESENT, PREDOMINANTLY PMN MODERATE GRAM POSITIVE COCCI    Culture   Final    NO GROWTH 3 DAYS NO ANAEROBES ISOLATED; CULTURE IN PROGRESS FOR 5 DAYS Performed at [facility], [address], [place], [place] [address]    Report Status PENDING  Incomplete         Radiology Studies: Dg Chest 2 View  Result Date: 02/02/2018 CLINICAL DATA:  Chest tube in place EXAM: CHEST - 2 VIEW COMPARISON:  Two days ago FINDINGS: Stable chest tube positioning at the left base where there is opacity from a fluid collection by CT. Low volumes. No visible pneumothorax. IMPRESSION: Unchanged pleural based opacity at the left base with superimposed percutaneous catheter. Electronically Signed   By: Marnee Spring M.D.   On: 02/02/2018 09:16        Scheduled Meds: . enoxaparin (LOVENOX) injection  80 mg Subcutaneous Q24H  . furosemide  40 mg Oral BID  .  lamoTRIgine  100 mg Oral QHS  . levothyroxine  75 mcg Oral QAC breakfast  . potassium chloride  40 mEq Oral BID  . potassium chloride  40 mEq Oral Once  . rivastigmine  4.5 mg Oral QHS  . sodium chloride flush  5 mL Intracatheter Q8H  . venlafaxine XR  75 mg Oral Q breakfast   Continuous Infusions: . sodium chloride 10 mL/hr at 01/30/18 1351  . piperacillin-tazobactam (ZOSYN)  IV 3.375 g (02/03/18 0312)  . potassium chloride 10 mEq (02/03/18 1132)     LOS: 6 days    Time spent: 35 minutes.     Alba Cory, MD [facility] Pager [phone]  If 7PM-7AM, please contact night-coverage [email or web address] Password [facility] 02/03/2018, 12:04 PM

## 2018-02-04 LAB — CBC
HCT: 39.8 % (ref 39.0–52.0)
HEMOGLOBIN: 12.1 g/dL — AB (ref 13.0–17.0)
MCH: 28.8 pg (ref 26.0–34.0)
MCHC: 30.4 g/dL (ref 30.0–36.0)
MCV: 94.8 fL (ref 78.0–100.0)
PLATELETS: 319 10*3/uL (ref 150–400)
RBC: 4.2 MIL/uL — ABNORMAL LOW (ref 4.22–5.81)
RDW: 16.8 % — AB (ref 11.5–15.5)
WBC: 5.8 10*3/uL (ref 4.0–10.5)

## 2018-02-04 LAB — BASIC METABOLIC PANEL
Anion gap: 16 — ABNORMAL HIGH (ref 5–15)
BUN: 25 mg/dL — ABNORMAL HIGH (ref 6–20)
CALCIUM: 8.6 mg/dL — AB (ref 8.9–10.3)
CHLORIDE: 95 mmol/L — AB (ref 98–111)
CO2: 27 mmol/L (ref 22–32)
CREATININE: 1.43 mg/dL — AB (ref 0.61–1.24)
Glucose, Bld: 122 mg/dL — ABNORMAL HIGH (ref 70–99)
Potassium: 2.7 mmol/L — CL (ref 3.5–5.1)
SODIUM: 138 mmol/L (ref 135–145)

## 2018-02-04 LAB — MAGNESIUM: MAGNESIUM: 2 mg/dL (ref 1.7–2.4)

## 2018-02-04 MED ORDER — POTASSIUM CHLORIDE 10 MEQ/100ML IV SOLN
10.0000 meq | INTRAVENOUS | Status: AC
  Administered 2018-02-04 (×3): 10 meq via INTRAVENOUS
  Filled 2018-02-04 (×4): qty 100

## 2018-02-04 MED ORDER — POTASSIUM CHLORIDE CRYS ER 20 MEQ PO TBCR
40.0000 meq | EXTENDED_RELEASE_TABLET | Freq: Once | ORAL | Status: AC
  Administered 2018-02-04: 40 meq via ORAL
  Filled 2018-02-04: qty 2

## 2018-02-04 MED ORDER — POTASSIUM CHLORIDE CRYS ER 20 MEQ PO TBCR
40.0000 meq | EXTENDED_RELEASE_TABLET | Freq: Three times a day (TID) | ORAL | Status: DC
  Administered 2018-02-04 – 2018-02-06 (×7): 40 meq via ORAL
  Filled 2018-02-04 (×7): qty 2

## 2018-02-04 MED ORDER — POTASSIUM CHLORIDE CRYS ER 20 MEQ PO TBCR: 40.0000 meq | EXTENDED_RELEASE_TABLET | Freq: Three times a day (TID) | ORAL | Status: DC

## 2018-02-04 NOTE — Progress Notes (Signed)
Progress Note  Patient Name: Joshua Reilly Date of Encounter: 02/04/2018  Primary Cardiologist: Tobias AlexanderKatarina Nelson, MD  Subjective   Pt doing well today. Disappointed that he cannot go home today. Father at bedside. Denies chest pain pr SOB. Chest tube remains in place   Inpatient Medications    Scheduled Meds: . enoxaparin (LOVENOX) injection  80 mg Subcutaneous Q24H  . furosemide  40 mg Oral BID  . lamoTRIgine  100 mg Oral QHS  . levothyroxine  75 mcg Oral QAC breakfast  . potassium chloride  40 mEq Oral TID  . rivastigmine  4.5 mg Oral QHS  . sodium chloride flush  5 mL Intracatheter Q8H  . venlafaxine XR  75 mg Oral Q breakfast   Continuous Infusions: . sodium chloride 10 mL/hr at 01/30/18 1351  . piperacillin-tazobactam (ZOSYN)  IV 3.375 g (02/04/18 0400)  . vancomycin 1,250 mg (02/04/18 0404)   PRN Meds: sodium chloride, acetaminophen **OR** acetaminophen, ondansetron **OR** ondansetron (ZOFRAN) IV   Vital Signs    Vitals:   02/03/18 1709 02/04/18 0100 02/04/18 0814 02/04/18 0824  BP: (!) 99/57 (!) 97/54  (!) 111/45  Pulse: 73 83  87  Resp: 17 16  16   Temp: 98.2 F (36.8 C) 98.6 F (37 C)  98.2 F (36.8 C)  TempSrc: Oral Oral  Oral  SpO2: 95% 94%  98%  Weight:   (!) 156 kg   Height:        Intake/Output Summary (Last 24 hours) at 02/04/2018 0913 Last data filed at 02/04/2018 0800 Gross per 24 hour  Intake 1485 ml  Output 2853 ml  Net -1368 ml   Filed Weights   02/01/18 0400 02/02/18 0500 02/04/18 0814  Weight: (!) 156.5 kg (!) 153.4 kg (!) 156 kg    Physical Exam   General: Well developed, well nourished, NAD Skin: Warm, dry, intact  Head: Normocephalic, atraumatic, clear, moist mucus membranes. Neck: Negative for carotid bruits. No JVD Lungs: Diminished bilaterally. No wheezes, rales, or rhonchi. Breathing is unlabored. Cardiovascular: RRR with S1 S2. + murmur. No rubs, gallops, or LV heave appreciated. Abdomen: Soft, non-tender, non-distended  with normoactive bowel sounds. No obvious abdominal masses. MSK: Strength and tone appear normal for age. 5/5 in all extremities Extremities: 1+ BLE edema. No clubbing or cyanosis. DP/PT pulses 2+ bilaterally Neuro: Alert and oriented to self and place. No focal deficits. No facial asymmetry. MAE spontaneously. Psych: Responds to questions somewhat appropriately with normal affect.    Labs    Chemistry Recent Labs  Lab 01/29/18 [phone]  02/02/18 0907 02/03/18 0244 02/04/18 0211  NA 139   < > 135 137 138  K 3.7   < > 3.0* 2.8* 2.7*  CL 96*   < > 92* 93* 95*  CO2 32   < > 29 28 27   GLUCOSE 146*   < > 185* 138* 122*  BUN 12   < > 21* 24* 25*  CREATININE 1.13   < > 1.44* 1.49* 1.43*  CALCIUM 7.9*   < > 8.3* 8.7* 8.6*  PROT 6.4*  --   --   --   --   ALBUMIN 2.3*  --   --   --   --   AST 18  --   --   --   --   ALT 12  --   --   --   --   ALKPHOS 135*  --   --   --   --  BILITOT 0.5  --   --   --   --   GFRNONAA >60   < > 60* 58* >60  GFRAA >60   < > >60 >60 >60  ANIONGAP 11   < > 14 16* 16*   < > = values in this interval not displayed.     Hematology Recent Labs  Lab 02/01/18 0729 02/02/18 0907 02/04/18 0211  WBC 7.9 5.1 5.8  RBC 4.49 4.37 4.20*  HGB 13.2 12.7* 12.1*  HCT 42.8 41.6 39.8  MCV 95.3 95.2 94.8  MCH 29.4 29.1 28.8  MCHC 30.8 30.5 30.4  RDW 16.7* 16.6* 16.8*  PLT 343 323 319    Cardiac Enzymes Recent Labs  Lab 01/29/18 0814 01/29/18 1029  TROPONINI <0.03 <0.03   No results for input(s): TROPIPOC in the last 168 hours.   BNP Recent Labs  Lab 01/29/18 0814  BNP 10.9     DDimer No results for input(s): DDIMER in the last 168 hours.   Radiology    No results found.  Telemetry    02/04/18 NSR with rates in the 80's - Personally Reviewed  ECG    No new tracings as of 02/04/18- Personally Reviewed  Cardiac Studies   Echo 01/30/2018  - Left ventricle: The cavity size was normal. Wall thickness was normal. Systolic function was  normal. The estimated ejection fraction was in the range of 60% to 65%. Although no diagnostic regional wall motion abnormality was identified, this possibility cannot be completely excluded on the basis of this study. Features are consistent with a pseudonormal left ventricular filling pattern, with concomitant abnormal relaxation and increased filling pressure (grade 2 diastolic dysfunction). - Aortic valve: There was no stenosis. - Mitral valve: There was no significant regurgitation. - Right ventricle: The cavity size was normal. Systolic function was normal. - Pulmonic valve: The findings are consistent with mild stenosis. There was no regurgitation. Peak gradient (S): 23 mm Hg. - Pulmonary arteries: No complete TR doppler jet so unable to estimate PA systolic pressure. - Inferior vena cava: The vessel was normal in size. The respirophasic diameter changes were in the normal range (>= 50%), consistent with normal central venous pressure.  Impressions:  - Normal LV size with EF 60-65%. Moderate diastolic dysfunction. Normal RV size and systolic function. Mild pulmonic stenosis with peak gradient 23 mmHg.  Patient Profile     37 y.o. male with h/o Down Syndrome, who was adopted at age of 82 months. As a child he was diagnosed with pulmonary stenosis that didn't cause any symptoms, he was very active enrolled in multiple sports. With age he gained weight, became less active, currently lives in assisted living facility. He underwent left pleural effusion thoracentesis in June - no further details on fluid studies. He developed DOE, LE edema about 5 days ago, presented to [facility] - with negative venous US for DVT. Chest CT negative for pulmonary embolism, and showed moderate left pleural effusion suspicious for empyema. Smaller when compared to the prior in June. The patient has had fever/chills few days ago, and productive cough.  Assessment & Plan     1. Acute on chronic diastolic heart failure: -Per echocardiogram with LVEF 60-65% with NWMA and grade 2 DD -Weight, 343lb today, down from 361lb on admission  -I&O, net negative 6.4L since admission  -Continue Lasix 40mg  PO twice daily with K+ supplementation  -Metolazone discontinued yesterday given renal function and hypokalemia -Will monitor fluid volume balance closely   2.  Hypokalemia: -K+, 2.7 this AM -Replaced with Kdur x1, TID per primary team  -Will follow closely with daily BMET -No arrhthymias per telemetry review   3. Pulmonic stenosis: -Stable, known pulmonic stenosis from childhood>per echocardiogram 01/30/18 with mild stenosis, no regurgitation and peak gradient of  4. Empyema with PNA: -s/p VATS per TCTS team with chest tube in place  -Continue IV Zosyn per primary team  -Afebrile   5. CKD Stage III: -Creatinine, 1.43 today down from 1.49 yesterday  -Baseline appears to be in the 1.2-1.4 range -Metolazone discontinued 02/03/18 given above    Signed, Georgie Chard NP-C [facility] Pager: [phone] 02/04/2018, 9:13 AM     For questions or updates, please contact   Please consult [email or web address] for contact info under Cardiology/STEMI.

## 2018-02-04 NOTE — Plan of Care (Signed)
Discussed with patient plan of care, pain management and contact precaution with some teach back displayed

## 2018-02-04 NOTE — Progress Notes (Signed)
Spoke with Architectfloor RN.  Patient has had multiple PIV (6) since admission.  Several requiring start by IV team.  Reports pt. pulls PIV.   Last PIV started by  IV team this afternoon. IV team unable to keep placing PIV's to be pulled out.  Floor RN to contact MD to  find other alternatives.

## 2018-02-04 NOTE — Progress Notes (Signed)
PROGRESS NOTE    Joshua Reilly  ZOX:[id number] DOB: 22-Mar-1981 DOA: 01/28/2018 PCP: Shelbie Ammons, MD    Brief Narrative:37 year old male, resident of an ALF, PMH of Down syndrome, pulmonary stenosis, chronic CHF, OSA awaiting CPAP, not on home oxygen, abdominal hernia, as per history provided by father, patient was hospitalized sometime in 2018 and had 2 chest tubes placed on the right side which drained thick fluid, underwent left-sided thoracentesis in June at [facility], now transferred from [facility] on 9/10 with a couple of weeks history of progressive lower extremity edema and dyspnea on mild exertion.  He has had low-grade fevers but no cough.  Evaluation at [facility] showed CTA negative for PE, loculated left pleural effusion concerning for empyema.  As per family, lower extremity Dopplers a week ago at ALF reportedly negative for DVT.  Lasix dose recently increased as outpatient.  Patient sees pulmonologist Dr. Blenda Nicely who evaluated him at [facility] ED and recommended admission to a tertiary cancer center for evaluation by CTS surgery, consideration for VATS.  Patient was thereby transferred to [facility].   Assessment & Plan:   Principal Problem:   Empyema of left pleural space (HCC) Active Problems:   CHF (congestive heart failure) (HCC)   OSA (obstructive sleep apnea)   Dyspnea   Acute diastolic heart failure (HCC)   Chest tube in place   Hypokalemia   Pulmonary valve stenosis   CKD (chronic kidney disease), stage III (HCC)   Loculated left pleural effusion;  Empyema.  Prior history of right side empyema.  Continue with IV antibiotics. Vancomycin and Zosyn.  CVTS recommend IR Chest tube placement.  Underwent chest tube placement by IR 9-12.  Culture Gram stain: growing gram positive cocci. Follow results.culture re incubated.  Chest x ray stable.  Follow CVTS recommendation for chest tube.  20 cc from chest tube since yesterday.  Continue with IV  antibiotics.   Acute in chronic diastolic Heart failure exacerbation.  Pulmonary stenosis;  ECHO diastolic dysfunction.  Cardiology following.  Continue with lasix to 40 mg BID Urine out put yesterday 2.3 L Holding metolazone.   Hypokalemia; re[plete orally and IV  OSA;  Apparently recently diagnosed and awaiting CPAP.  Follows with pulmonology in [place].  Morbid obesity/Body mass index is 41.87 kg/m.  Down syndrome: Mental status at baseline.  Anemia; follow trend.  Hb stable.   DVT prophylaxis: Lovenox Code Status; full code.  Family Communication: step mother at bedside.  Disposition Plan: home when stable.    Consultants:   CVTS  Cardiology    Procedures:   Chest tube  ECHO   Antimicrobials:   Zosyn    Subjective: Wants to go home.  Denies dyspnea.   Objective: Vitals:   02/03/18 1709 02/04/18 0100 02/04/18 0814 02/04/18 0824  BP: (!) 99/57 (!) 97/54  (!) 111/45  Pulse: 73 83  87  Resp: 17 16  16   Temp: 98.2 F (36.8 C) 98.6 F (37 C)  98.2 F (36.8 C)  TempSrc: Oral Oral  Oral  SpO2: 95% 94%  98%  Weight:   (!) 156 kg   Height:        Intake/Output Summary (Last 24 hours) at 02/04/2018 1252 Last data filed at 02/04/2018 0800 Gross per 24 hour  Intake 930 ml  Output 2853 ml  Net -1923 ml   Filed Weights   02/01/18 0400 02/02/18 0500 02/04/18 0814  Weight: (!) 156.5 kg (!) 153.4 kg (!) 156 kg    Examination:  General exam: NAD Respiratory system: Chest tube in place, CTA Cardiovascular system: S 1, S 2 RRR Gastrointestinal system: BS present, soft, nt Central nervous system:  Alert.  Extremities: no edema Skin: Mild redness.    Data Reviewed: I have personally reviewed following labs and imaging studies  CBC: Recent Labs  Lab 01/30/18 0440 01/31/18 0414 02/01/18 0729 02/02/18 0907 02/04/18 0211  WBC 5.6 6.3 7.9 5.1 5.8  HGB 11.7* 12.4* 13.2 12.7* 12.1*  HCT 39.2 41.1 42.8 41.6 39.8  MCV 97.0 95.1 95.3 95.2  94.8  PLT 408* 348 343 323 319   Basic Metabolic Panel: Recent Labs  Lab 01/31/18 0414 02/01/18 0729 02/02/18 0907 02/03/18 0244 02/04/18 0211  NA 137 137 135 137 138  K 3.2* 2.7* 3.0* 2.8* 2.7*  CL 93* 91* 92* 93* 95*  CO2 30 33* 29 28 27   GLUCOSE 143* 140* 185* 138* 122*  BUN 16 22* 21* 24* 25*  CREATININE 1.45* 1.50* 1.44* 1.49* 1.43*  CALCIUM 8.1* 8.4* 8.3* 8.7* 8.6*  MG  --   --   --   --  2.0   GFR: Estimated Creatinine Clearance: 98.8 mL/min (A) (by C-G formula based on SCr of 1.43 mg/dL (H)). Liver Function Tests: Recent Labs  Lab 01/29/18 0814  AST 18  ALT 12  ALKPHOS 135*  BILITOT 0.5  PROT 6.4*  ALBUMIN 2.3*   No results for input(s): LIPASE, AMYLASE in the last 168 hours. No results for input(s): AMMONIA in the last 168 hours. Coagulation Profile: Recent Labs  Lab 01/30/18 0440  INR 1.33   Cardiac Enzymes: Recent Labs  Lab 01/29/18 0814 01/29/18 1029  TROPONINI <0.03 <0.03   BNP (last 3 results) No results for input(s): PROBNP in the last 8760 hours. HbA1C: No results for input(s): HGBA1C in the last 72 hours. CBG: No results for input(s): GLUCAP in the last 168 hours. Lipid Profile: No results for input(s): CHOL, HDL, LDLCALC, TRIG, CHOLHDL, LDLDIRECT in the last 72 hours. Thyroid Function Tests: No results for input(s): TSH, T4TOTAL, FREET4, T3FREE, THYROIDAB in the last 72 hours. Anemia Panel: No results for input(s): VITAMINB12, FOLATE, FERRITIN, TIBC, IRON, RETICCTPCT in the last 72 hours. Sepsis Labs: No results for input(s): PROCALCITON, LATICACIDVEN in the last 168 hours.  Recent Results (from the past 240 hour(s))  MRSA PCR Screening     Status: Abnormal   Collection Time: 01/29/18  2:24 AM  Result Value Ref Range Status   MRSA by PCR POSITIVE (A) NEGATIVE Final    Comment:        The GeneXpert MRSA Assay (FDA approved for NASAL specimens only), is one component of a comprehensive MRSA colonization surveillance program. It  is not intended to diagnose MRSA infection nor to guide or monitor treatment for MRSA infections. RESULT CALLED TO, READ BACK BY AND VERIFIED WITHLeta Baptist: C BRADLEY RN [phone] 01/29/18 A BROWNING Performed at [facility], [address] [place], [place] [address]   Surgical pcr screen     Status: None   Collection Time: 01/29/18  4:08 PM  Result Value Ref Range Status   MRSA, PCR NEGATIVE NEGATIVE Final   Staphylococcus aureus NEGATIVE NEGATIVE Final    Comment: (NOTE) The Xpert SA Assay (FDA approved for NASAL specimens in patients 39 years of age and older), is one component of a comprehensive surveillance program. It is not intended to diagnose infection nor to guide or monitor treatment. Performed at [facility], [address]  [place], [place] [address]   Aerobic/Anaerobic Culture (surgical/deep wound)     Status: None (Preliminary result)   Collection Time: 01/30/18  4:49 PM  Result Value Ref Range Status   Specimen Description PLEURAL  Final   Special Requests NONE  Final   Gram Stain   Final    ABUNDANT WBC PRESENT, PREDOMINANTLY PMN MODERATE GRAM POSITIVE COCCI    Culture   Final    CULTURE REINCUBATED FOR BETTER GROWTH Performed at [facility], [address], [place], [place] [address]    Report Status PENDING  Incomplete         Radiology Studies: No results found.      Scheduled Meds: . enoxaparin (LOVENOX) injection  80 mg Subcutaneous Q24H  . furosemide  40 mg Oral BID  . lamoTRIgine  100 mg Oral QHS  . levothyroxine  75 mcg Oral QAC breakfast  . potassium chloride  40 mEq Oral TID  . rivastigmine  4.5 mg Oral QHS  . sodium chloride flush  5 mL Intracatheter Q8H  . venlafaxine XR  75 mg Oral Q breakfast   Continuous Infusions: . sodium chloride 10 mL/hr at 01/30/18 1351  . piperacillin-tazobactam (ZOSYN)  IV Stopped (02/04/18 1235)  . vancomycin Stopped (02/04/18 1235)     LOS: 7 days    Time spent: 35 minutes.      Alba Cory, MD [facility] Pager [phone]  If 7PM-7AM, please contact night-coverage [email or web address] Password [facility] 02/04/2018, 12:52 PM

## 2018-02-04 NOTE — Progress Notes (Signed)
Procedure(s) (LRB): VIDEO ASSISTED THORACOSCOPY (VATS)/EMPYEMA (Left) Subjective: 50 cc cloudy material today, only 10 cc yesterday Keep drain in placeuntil drainage about 30/day or less for 2 consecutive days  Objective: Vital signs in last 24 hours: Temp:  [98.2 F (36.8 C)-98.6 F (37 C)] 98.2 F (36.8 C) (09/16 0824) Pulse Rate:  [73-87] 87 (09/16 0824) Cardiac Rhythm: Normal sinus rhythm (09/16 0830) Resp:  [16-17] 16 (09/16 0824) BP: (97-111)/(45-57) 111/45 (09/16 0824) SpO2:  [94 %-98 %] 98 % (09/16 0824) Weight:  [156 kg] 156 kg (09/16 0814)  Hemodynamic parameters for last 24 hours:    Intake/Output from previous day: 09/15 0701 - 09/16 0700 In: 1150 [P.O.:520; I.V.:15; IV Piggyback:600] Out: 2363 [Urine:2352; Drains:10; Stool:1] Intake/Output this shift: Total I/O In: 540 [P.O.:240; IV Piggyback:300] Out: 500 [Urine:500]  Lungs clear  Lab Results: Recent Labs    02/02/18 0907 02/04/18 0211  WBC 5.1 5.8  HGB 12.7* 12.1*  HCT 41.6 39.8  PLT 323 319   BMET:  Recent Labs    02/03/18 0244 02/04/18 0211  NA 137 138  K 2.8* 2.7*  CL 93* 95*  CO2 28 27  GLUCOSE 138* 122*  BUN 24* 25*  CREATININE 1.49* 1.43*  CALCIUM 8.7* 8.6*    PT/INR: No results for input(s): LABPROT, INR in the last 72 hours. ABG    Component Value Date/Time   PHART 7.441 01/30/2018 0530   HCO3 33.3 (H) 01/30/2018 0530   O2SAT 90.0 01/30/2018 0530   CBG (last 3)  No results for input(s): GLUCAP in the last 72 hours.  Assessment/Plan: S/P Procedure(s) (LRB): VIDEO ASSISTED THORACOSCOPY (VATS)/EMPYEMA (Left) Cont pleural drain Cont IV antibiotics Will follow  LOS: 7 days    Kathlee Nationseter Van Trigt III 02/04/2018

## 2018-02-04 NOTE — Progress Notes (Signed)
Nutrition Consult/Brief Note  RD consulted for diet education.  Per RN pt is not appropriate for education. Mother and Father not present at time of RD visit. Will try and revisit at later date.  Maureen ChattersKatie Lynnie Koehler, RD, LDN Pager #: [phone] After-Hours Pager #: [phone]

## 2018-02-04 NOTE — Progress Notes (Signed)
CRITICAL VALUE ALERT  Critical Value:  Potassium 2.7  Date & Time Notied:  02/04/18 0417  Provider Notified: Bruna PotterBlount NP  Orders Received/Actions taken: See [facility]

## 2018-02-05 LAB — BASIC METABOLIC PANEL
ANION GAP: 14 (ref 5–15)
BUN: 22 mg/dL — ABNORMAL HIGH (ref 6–20)
CALCIUM: 8.8 mg/dL — AB (ref 8.9–10.3)
CO2: 31 mmol/L (ref 22–32)
Chloride: 93 mmol/L — ABNORMAL LOW (ref 98–111)
Creatinine, Ser: 1.38 mg/dL — ABNORMAL HIGH (ref 0.61–1.24)
GLUCOSE: 132 mg/dL — AB (ref 70–99)
Potassium: 2.9 mmol/L — ABNORMAL LOW (ref 3.5–5.1)
SODIUM: 138 mmol/L (ref 135–145)

## 2018-02-05 LAB — AEROBIC/ANAEROBIC CULTURE W GRAM STAIN (SURGICAL/DEEP WOUND)

## 2018-02-05 LAB — AEROBIC/ANAEROBIC CULTURE (SURGICAL/DEEP WOUND)

## 2018-02-05 MED ORDER — POTASSIUM CHLORIDE CRYS ER 20 MEQ PO TBCR
40.0000 meq | EXTENDED_RELEASE_TABLET | Freq: Once | ORAL | Status: AC
  Administered 2018-02-05: 40 meq via ORAL
  Filled 2018-02-05: qty 2

## 2018-02-05 NOTE — Progress Notes (Signed)
Progress Note  Patient Name: Joshua Reilly Date of Encounter: 02/05/2018  Primary Cardiologist: Tobias AlexanderKatarina Nelson, MD  Subjective   No cardiac complaints   Inpatient Medications    Scheduled Meds: . enoxaparin (LOVENOX) injection  80 mg Subcutaneous Q24H  . furosemide  40 mg Oral BID  . lamoTRIgine  100 mg Oral QHS  . levothyroxine  75 mcg Oral QAC breakfast  . potassium chloride  40 mEq Oral TID  . rivastigmine  4.5 mg Oral QHS  . sodium chloride flush  5 mL Intracatheter Q8H  . venlafaxine XR  75 mg Oral Q breakfast   Continuous Infusions: . sodium chloride 10 mL/hr at 01/30/18 1351  . piperacillin-tazobactam (ZOSYN)  IV 3.375 g (02/05/18 0235)  . vancomycin 1,250 mg (02/05/18 0231)   PRN Meds: sodium chloride, acetaminophen **OR** acetaminophen, ondansetron **OR** ondansetron (ZOFRAN) IV   Vital Signs    Vitals:   02/04/18 1730 02/05/18 0101 02/05/18 0500 02/05/18 0759  BP: (!) 109/58 124/62  116/61  Pulse: 73 66  66  Resp: 18 16    Temp: (!) 97.4 F (36.3 C) 97.8 F (36.6 C)  97.7 F (36.5 C)  TempSrc: Oral Oral  Oral  SpO2: 99% 97%  99%  Weight:   (!) 155.9 kg   Height:        Intake/Output Summary (Last 24 hours) at 02/05/2018 0917 Last data filed at 02/05/2018 0600 Gross per 24 hour  Intake 1145 ml  Output -  Net 1145 ml   Filed Weights   02/02/18 0500 02/04/18 0814 02/05/18 0500  Weight: (!) 153.4 kg (!) 156 kg (!) 155.9 kg    Physical Exam   Affect appropriate Down syndrome facies  HEENT: normal Neck supple with no adenopathy JVP normal no bruits no thyromegaly Lungs decreased BS left base with chest tube  Heart:  S1/S2 mild PS  murmur, no rub, gallop or click PMI normal Abdomen: benighn, BS positve, no tenderness, no AAA no bruit.  No HSM or HJR Distal pulses intact with no bruits No edema Neuro non-focal Skin warm and dry No muscular weakness   Labs    Chemistry Recent Labs  Lab 02/03/18 0244 02/04/18 0211 02/05/18 0803    NA 137 138 138  K 2.8* 2.7* 2.9*  CL 93* 95* 93*  CO2 28 27 31   GLUCOSE 138* 122* 132*  BUN 24* 25* 22*  CREATININE 1.49* 1.43* 1.38*  CALCIUM 8.7* 8.6* 8.8*  GFRNONAA 58* >60 >60  GFRAA >60 >60 >60  ANIONGAP 16* 16* 14     Hematology Recent Labs  Lab 02/01/18 0729 02/02/18 0907 02/04/18 0211  WBC 7.9 5.1 5.8  RBC 4.49 4.37 4.20*  HGB 13.2 12.7* 12.1*  HCT 42.8 41.6 39.8  MCV 95.3 95.2 94.8  MCH 29.4 29.1 28.8  MCHC 30.8 30.5 30.4  RDW 16.7* 16.6* 16.8*  PLT 343 323 319    Cardiac Enzymes Recent Labs  Lab 01/29/18 1029  TROPONINI <0.03   Radiology    CXR: 02/02/18 -Unchanged pleural based opacity at the left base with superimposed percutaneous catheter.  Telemetry    02/04/18 NSR with rates in the 80's - Personally Reviewed  ECG    No new tracings as of 02/04/18- Personally Reviewed  Cardiac Studies   Echo 01/30/2018  - Left ventricle: The cavity size was normal. Wall thickness was normal. Systolic function was normal. The estimated ejection fraction was in the range of 60% to 65%. Although no diagnostic  regional wall motion abnormality was identified, this possibility cannot be completely excluded on the basis of this study. Features are consistent with a pseudonormal left ventricular filling pattern, with concomitant abnormal relaxation and increased filling pressure (grade 2 diastolic dysfunction). - Aortic valve: There was no stenosis. - Mitral valve: There was no significant regurgitation. - Right ventricle: The cavity size was normal. Systolic function was normal. - Pulmonic valve: The findings are consistent with mild stenosis. There was no regurgitation. Peak gradient (S): 23 mm Hg. - Pulmonary arteries: No complete TR doppler jet so unable to estimate PA systolic pressure. - Inferior vena cava: The vessel was normal in size. The respirophasic diameter changes were in the normal range (>= 50%), consistent with  normal central venous pressure.  Impressions:  - Normal LV size with EF 60-65%. Moderate diastolic dysfunction. Normal RV size and systolic function. Mild pulmonic stenosis with peak gradient 23 mmHg.  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 As a child he was diagnosed with pulmonary stenosis that didn't cause any symptoms, he was very active enrolled in multiple sports. With age he gained weight, became less active, currently lives in assisted living facility. He underwent left pleural effusion thoracentesis in June - no further details on fluid studies. He developed DOE, LE edema about 5 days ago, presented to [facility] - with negative venous US for DVT. Chest CT negative for pulmonary embolism, and showed moderate left pleural effusion suspicious for empyema. Smaller when compared to the prior in June. The patient has had fever/chills few days ago, and productive cough.  Assessment & Plan    1. Acute on chronic diastolic heart failure: -Per echocardiogram with LVEF 60-65% with NWMA and grade 2 DD -Weight, down  -I&O, negative over 8 liters since admission  -Continue Lasix 40mg  PO twice daily with K+ supplementation   2. Hypokalemia: -K+, 2.7 this AM -Replaced per primary team    3. Pulmonic stenosis: -Stable, known pulmonic stenosis from childhood>per echocardiogram 01/30/18 with mild stenosis, no regurgitation and peak gradient of  4. Empyema with PNA: -s/p VATS per TCTS team with chest tube in place  -Continue IV Zosyn per primary team  -Afebrile  - PVT indicates 2 consecutive days of less than 30 cc drainage before pulling   5. CKD Stage III: -Creatinine, 1.38 stable  -Baseline appears to be in the 1.2-1.4 range    Will sign off Outpatient f/u in a year for pulmonary stenosis and TTE F/U empyema and outpatient antibiotics per primary team  Charlton Haws

## 2018-02-05 NOTE — Progress Notes (Signed)
PT Cancellation and Discharge Note  Patient Details Name: Joshua Reilly MRN: [id number] DOB: 01-16-1981   Cancelled Treatment:    Reason Eval/Treat Not Completed: (P) Other (comment) Pt had PT Evaluation 02/03/18 and no needs were identified at that time and he was discharged from PT service. Per RN pt has not had a change in mobility status and is walking in room without assist. PT is signing off.   Joshua Reilly PT, DPT Acute Rehabilitation Services Pager [phone] Office [phone]  Elon Alaslizabeth B Van Reilly 02/05/2018, 12:16 PM

## 2018-02-05 NOTE — Care Management Important Message (Signed)
Important Message  Patient Details  Name: Joshua Reilly MRN: [id number] Date of Birth: Oct 10, 1980   Medicare Important Message Given:  Yes    Leone Havenaylor, Aliyah Abeyta Clinton, RN 02/05/2018, 12:56 PM

## 2018-02-05 NOTE — Progress Notes (Signed)
PROGRESS NOTE    Joshua Reilly  EXB:[id number] DOB: Jan 13, 1981 DOA: 01/28/2018 PCP: Shelbie Ammons, MD    Brief Narrative: 37 year old male, resident of an ALF, PMH of Down syndrome, pulmonary stenosis, chronic CHF, OSA  CPAP, not on home oxygen, abdominal hernia, as per history provided by father, patient was hospitalized sometime in 2018 and had 2 chest tubes placed on the right side which drained thick fluid, underwent left-sided thoracentesis in June at [facility], now transferred from [facility] on 9/10 with a couple of weeks history of progressive lower extremity edema and dyspnea on mild exertion.  He has had low-grade fevers but no cough.  Evaluation at [facility] showed CTA negative for PE, loculated left pleural effusion concerning for empyema.  As per family, lower extremity Dopplers a week ago at ALF reportedly negative for DVT.  Lasix dose recently increased as outpatient.  Patient sees pulmonologist Dr. Blenda Nicely who evaluated him at [facility] ED and recommended admission to a tertiary cancer center for evaluation by CTS surgery, consideration for VATS.  Patient was thereby transferred to [facility].  Patient was diurese with IV lasix and  metholazone. He is negative 7 L. His weigh has decreased to 343 from 361 on admission. He was transition to oral lasix. He was evaluated by cardiology during this admission.   Regarding Left side empyema, patient is consider high risk for surgery. Dr [name] recommend chest tube placement by IR. Patient underwent chest tube placement on 9-12. Plan is to continue with IV antibiotics until  discharge. Follow culture to determine oral antibiotics for discharge. Goal is for less  30 cc or less  for two consecutives days from chest tube to consider discontinuation of chest tube.    Assessment & Plan:   Principal Problem:   Empyema of left pleural space (HCC) Active Problems:   CHF (congestive heart failure) (HCC)   OSA (obstructive sleep  apnea)   Dyspnea   Acute diastolic heart failure (HCC)   Chest tube in place   Hypokalemia   Pulmonary valve stenosis   CKD (chronic kidney disease), stage III (HCC)   Loculated left pleural effusion;  Empyema.  Prior history of right side empyema.  Continue with IV antibiotics. Vancomycin and Zosyn.  CVTS recommend IR Chest tube placement.  Underwent chest tube placement by IR 9-12.  Culture Gram stain: growing gram positive cocci. Follow results.culture re incubated.  Chest x ray stable.  Continue with IV antibiotics.  Plan to remove chest tube when draining 30 cc or less from chest tube.   Acute in chronic diastolic Heart failure exacerbation.  Pulmonary stenosis;  ECHO diastolic dysfunction.  Cardiology following.  Continue with lasix to 40 mg BID Holding metolazone.   Hypokalemia; getting 40 meq TID.  Will order extra dose 40   OSA;  Apparently recently diagnosed and awaiting CPAP.  Follows with pulmonology in [place].  Morbid obesity/Body mass index is 41.87 kg/m.  Down syndrome: Mental status at baseline.  Anemia; follow trend.  Hb stable.   DVT prophylaxis: Lovenox Code Status; full code.  Family Communication: step mother at bedside.  Disposition Plan: home when stable.    Consultants:   CVTS  Cardiology    Procedures:   Chest tube  ECHO   Antimicrobials:   Zosyn    Subjective: Denies dyspnea.  Father at bedside.    Objective: Vitals:   02/04/18 1730 02/05/18 0101 02/05/18 0500 02/05/18 0759  BP: (!) 109/58 124/62  116/61  Pulse: 73  66  66  Resp: 18 16    Temp: (!) 97.4 F (36.3 C) 97.8 F (36.6 C)  97.7 F (36.5 C)  TempSrc: Oral Oral  Oral  SpO2: 99% 97%  99%  Weight:   (!) 155.9 kg   Height:        Intake/Output Summary (Last 24 hours) at 02/05/2018 1614 Last data filed at 02/05/2018 1520 Gross per 24 hour  Intake 1206.14 ml  Output 2000 ml  Net -793.86 ml   Filed Weights   02/02/18 0500 02/04/18 0814 02/05/18  0500  Weight: (!) 153.4 kg (!) 156 kg (!) 155.9 kg    Examination:  General exam: NAD Respiratory system: Chest tube in place, CTA Cardiovascular system: S 1, S 2 RRR Gastrointestinal system: BS present, soft,nt Central nervous system:  Alert.  Extremities: less edema Skin: Mild redness.    Data Reviewed: I have personally reviewed following labs and imaging studies  CBC: Recent Labs  Lab 01/30/18 0440 01/31/18 0414 02/01/18 0729 02/02/18 0907 02/04/18 0211  WBC 5.6 6.3 7.9 5.1 5.8  HGB 11.7* 12.4* 13.2 12.7* 12.1*  HCT 39.2 41.1 42.8 41.6 39.8  MCV 97.0 95.1 95.3 95.2 94.8  PLT 408* 348 343 323 319   Basic Metabolic Panel: Recent Labs  Lab 02/01/18 0729 02/02/18 0907 02/03/18 0244 02/04/18 0211 02/05/18 0803  NA 137 135 137 138 138  K 2.7* 3.0* 2.8* 2.7* 2.9*  CL 91* 92* 93* 95* 93*  CO2 33* 29 28 27 31   GLUCOSE 140* 185* 138* 122* 132*  BUN 22* 21* 24* 25* 22*  CREATININE 1.50* 1.44* 1.49* 1.43* 1.38*  CALCIUM 8.4* 8.3* 8.7* 8.6* 8.8*  MG  --   --   --  2.0  --    GFR: Estimated Creatinine Clearance: 102.3 mL/min (A) (by C-G formula based on SCr of 1.38 mg/dL (H)). Liver Function Tests: No results for input(s): AST, ALT, ALKPHOS, BILITOT, PROT, ALBUMIN in the last 168 hours. No results for input(s): LIPASE, AMYLASE in the last 168 hours. No results for input(s): AMMONIA in the last 168 hours. Coagulation Profile: Recent Labs  Lab 01/30/18 0440  INR 1.33   Cardiac Enzymes: No results for input(s): CKTOTAL, CKMB, CKMBINDEX, TROPONINI in the last 168 hours. BNP (last 3 results) No results for input(s): PROBNP in the last 8760 hours. HbA1C: No results for input(s): HGBA1C in the last 72 hours. CBG: No results for input(s): GLUCAP in the last 168 hours. Lipid Profile: No results for input(s): CHOL, HDL, LDLCALC, TRIG, CHOLHDL, LDLDIRECT in the last 72 hours. Thyroid Function Tests: No results for input(s): TSH, T4TOTAL, FREET4, T3FREE, THYROIDAB in  the last 72 hours. Anemia Panel: No results for input(s): VITAMINB12, FOLATE, FERRITIN, TIBC, IRON, RETICCTPCT in the last 72 hours. Sepsis Labs: No results for input(s): PROCALCITON, LATICACIDVEN in the last 168 hours.  Recent Results (from the past 240 hour(s))  MRSA PCR Screening     Status: Abnormal   Collection Time: 01/29/18  2:24 AM  Result Value Ref Range Status   MRSA by PCR POSITIVE (A) NEGATIVE Final    Comment:        The GeneXpert MRSA Assay (FDA approved for NASAL specimens only), is one component of a comprehensive MRSA colonization surveillance program. It is not intended to diagnose MRSA infection nor to guide or monitor treatment for MRSA infections. RESULT CALLED TO, READ BACK BY AND VERIFIED WITHLeta Baptist: C BRADLEY RN [phone] 01/29/18 A BROWNING Performed at [facility],  [address], [place], [place] [address]   Surgical pcr screen     Status: None   Collection Time: 01/29/18  4:08 PM  Result Value Ref Range Status   MRSA, PCR NEGATIVE NEGATIVE Final   Staphylococcus aureus NEGATIVE NEGATIVE Final    Comment: (NOTE) The Xpert SA Assay (FDA approved for NASAL specimens in patients 14 years of age and older), is one component of a comprehensive surveillance program. It is not intended to diagnose infection nor to guide or monitor treatment. Performed at [facility], [address] [place], [place] [address]   Aerobic/Anaerobic Culture (surgical/deep wound)     Status: None   Collection Time: 01/30/18  4:49 PM  Result Value Ref Range Status   Specimen Description PLEURAL  Final   Special Requests NONE  Final   Gram Stain   Final    ABUNDANT WBC PRESENT, PREDOMINANTLY PMN MODERATE GRAM POSITIVE COCCI    Culture   Final    RARE VIRIDANS STREPTOCOCCUS NO ANAEROBES ISOLATED Performed at [facility], [address] [place], [place] [address]    Report Status 02/05/2018 FINAL  Final         Radiology Studies: No results  found.      Scheduled Meds: . enoxaparin (LOVENOX) injection  80 mg Subcutaneous Q24H  . furosemide  40 mg Oral BID  . lamoTRIgine  100 mg Oral QHS  . levothyroxine  75 mcg Oral QAC breakfast  . potassium chloride  40 mEq Oral TID  . rivastigmine  4.5 mg Oral QHS  . sodium chloride flush  5 mL Intracatheter Q8H  . venlafaxine XR  75 mg Oral Q breakfast   Continuous Infusions: . sodium chloride 10 mL/hr at 01/30/18 1351  . piperacillin-tazobactam (ZOSYN)  IV 3.375 g (02/05/18 1129)  . vancomycin 1,250 mg (02/05/18 1408)     LOS: 8 days    Time spent: 35 minutes.     Alba Cory, MD [facility] Pager [phone]  If 7PM-7AM, please contact night-coverage [email or web address] Password [email or web address] 02/05/2018, 4:14 PM

## 2018-02-06 ENCOUNTER — Inpatient Hospital Stay (HOSPITAL_COMMUNITY): Payer: Medicare Other

## 2018-02-06 LAB — CBC
HCT: 40.1 % (ref 39.0–52.0)
Hemoglobin: 12.3 g/dL — ABNORMAL LOW (ref 13.0–17.0)
MCH: 29.6 pg (ref 26.0–34.0)
MCHC: 30.7 g/dL (ref 30.0–36.0)
MCV: 96.4 fL (ref 78.0–100.0)
PLATELETS: 290 10*3/uL (ref 150–400)
RBC: 4.16 MIL/uL — ABNORMAL LOW (ref 4.22–5.81)
RDW: 17.4 % — ABNORMAL HIGH (ref 11.5–15.5)
WBC: 5 10*3/uL (ref 4.0–10.5)

## 2018-02-06 LAB — BASIC METABOLIC PANEL
ANION GAP: 14 (ref 5–15)
Anion gap: 15 (ref 5–15)
BUN: 22 mg/dL — AB (ref 6–20)
BUN: 19 mg/dL (ref 6–20)
CHLORIDE: 96 mmol/L — AB (ref 98–111)
CO2: 28 mmol/L (ref 22–32)
CO2: 29 mmol/L (ref 22–32)
CREATININE: 1.4 mg/dL — AB (ref 0.61–1.24)
Calcium: 8.4 mg/dL — ABNORMAL LOW (ref 8.9–10.3)
Calcium: 8.6 mg/dL — ABNORMAL LOW (ref 8.9–10.3)
Chloride: 96 mmol/L — ABNORMAL LOW (ref 98–111)
Creatinine, Ser: 1.36 mg/dL — ABNORMAL HIGH (ref 0.61–1.24)
GFR calc Af Amer: >60 mL/min (ref >60)
GFR calc non Af Amer: >60 mL/min (ref >60)
GLUCOSE: 176 mg/dL — AB (ref 70–99)
GLUCOSE: 142 mg/dL — AB (ref 70–99)
POTASSIUM: 3 mmol/L — AB (ref 3.5–5.1)
Potassium: 2.8 mmol/L — ABNORMAL LOW (ref 3.5–5.1)
SODIUM: 139 mmol/L (ref 135–145)
Sodium: 139 mmol/L (ref 135–145)

## 2018-02-06 MED ORDER — POTASSIUM CHLORIDE CRYS ER 20 MEQ PO TBCR
30.0000 meq | EXTENDED_RELEASE_TABLET | ORAL | Status: AC
  Administered 2018-02-06 (×2): 30 meq via ORAL
  Filled 2018-02-06 (×2): qty 1

## 2018-02-06 MED ORDER — POTASSIUM CHLORIDE CRYS ER 20 MEQ PO TBCR
40.0000 meq | EXTENDED_RELEASE_TABLET | Freq: Two times a day (BID) | ORAL | Status: DC
  Administered 2018-02-06 – 2018-02-07 (×2): 40 meq via ORAL
  Filled 2018-02-06 (×2): qty 2

## 2018-02-06 NOTE — NC FL2 (Signed)
[place] MEDICAID FL2 LEVEL OF CARE SCREENING TOOL     IDENTIFICATION  Patient Name: Joshua Reilly Birthdate: [birth year] Sex: male Admission Date (Current Location): 01/28/2018  [facility] and [place] Number:  Producer, television/film/video and Address:  [facility], [address] [place], [place] [address]      Provider Number: [id number]  Attending Physician Name and Address:  Zannie Cove, MD  Relative Name and Phone Number:  Casimiro Needle, father, [phone]    Current Level of Care: Hospital Recommended Level of Care: Assisted Living Facility Prior Approval Number:    Date Approved/Denied:   PASRR Number:    Discharge Plan: Other (Comment)(Assisted living facility)    Current Diagnoses: Patient Active Problem List   Diagnosis Date Noted  . Chest tube in place   . Hypokalemia   . Pulmonary valve stenosis   . CKD (chronic kidney disease), stage III (HCC)   . Dyspnea   . Acute diastolic heart failure (HCC)   . CHF (congestive heart failure) (HCC) 01/29/2018  . OSA (obstructive sleep apnea) 01/29/2018  . Empyema of left pleural space (HCC) 01/29/2018    Orientation RESPIRATION BLADDER Height & Weight     Place, Self  Normal Incontinent Weight: (!) 341 lb 4.4 oz (154.8 kg) Height:  5\' 6"  (167.6 cm)  BEHAVIORAL SYMPTOMS/MOOD NEUROLOGICAL BOWEL NUTRITION STATUS      Continent Diet(Heart healthy, thin fluids. Fluid restriction: 1500 mL Fluid )  AMBULATORY STATUS COMMUNICATION OF NEEDS Skin   Independent Verbally Normal                       Personal Care Assistance Level of Assistance  Bathing, Feeding, Dressing Bathing Assistance: Independent Feeding assistance: Independent Dressing Assistance: Independent     Functional Limitations Info  Sight, Hearing, Speech Sight Info: Adequate Hearing Info: Adequate Speech Info: Adequate    SPECIAL CARE FACTORS FREQUENCY                       Contractures Contractures Info: Not present     Additional Factors Info  Code Status, Allergies Code Status Info: Full Code Allergies Info: NO known allergies           Current Medications (02/06/2018):  This is the current hospital active medication list Current Facility-Administered Medications  Medication Dose Route Frequency Provider Last Rate Last Dose  . 0.9 %  sodium chloride infusion   Intravenous PRN Elease Etienne, MD 10 mL/hr at 01/30/18 1351    . acetaminophen (TYLENOL) tablet 650 mg  650 mg Oral Q6H PRN Emokpae, Ejiroghene E, MD       Or  . acetaminophen (TYLENOL) suppository 650 mg  650 mg Rectal Q6H PRN Emokpae, Ejiroghene E, MD      . enoxaparin (LOVENOX) injection 80 mg  80 mg Subcutaneous Q24H Regalado, Belkys A, MD   80 mg at 02/05/18 2126  . furosemide (LASIX) tablet 40 mg  40 mg Oral BID Regalado, Belkys A, MD   40 mg at 02/06/18 1024  . lamoTRIgine (LAMICTAL) tablet 100 mg  100 mg Oral QHS Regalado, Belkys A, MD   100 mg at 02/05/18 2125  . levothyroxine (SYNTHROID, LEVOTHROID) tablet 75 mcg  75 mcg Oral QAC breakfast Regalado, Belkys A, MD   75 mcg at 02/06/18 0615  . ondansetron (ZOFRAN) tablet 4 mg  4 mg Oral Q6H PRN Emokpae, Ejiroghene E, MD       Or  .  ondansetron (ZOFRAN) injection 4 mg  4 mg Intravenous Q6H PRN Emokpae, Ejiroghene E, MD      . piperacillin-tazobactam (ZOSYN) IVPB 3.375 g  3.375 g Intravenous Q8H Bryk, Veronda P, RPH 12.5 mL/hr at 02/06/18 1252 3.375 g at 02/06/18 1252  . potassium chloride (K-DUR,KLOR-CON) CR tablet 30 mEq  30 mEq Oral Q4H Kirby-Graham, Beather ArbourKaren J, NP   30 mEq at 02/06/18 1021  . potassium chloride SA (K-DUR,KLOR-CON) CR tablet 40 mEq  40 mEq Oral TID Regalado, Belkys A, MD   40 mEq at 02/06/18 1025  . rivastigmine (EXELON) capsule 4.5 mg  4.5 mg Oral QHS Regalado, Belkys A, MD   4.5 mg at 02/05/18 2126  . sodium chloride flush (NS) 0.9 % injection 5 mL  5 mL Intracatheter Q8H Oley BalmHassell, Daniel, MD   5 mL at 02/06/18 1256  . venlafaxine XR (EFFEXOR-XR) 24 hr capsule 75 mg   75 mg Oral Q breakfast Regalado, Belkys A, MD   75 mg at 02/06/18 [id number]     Discharge Medications: Please see discharge summary for a list of discharge medications.  Relevant Imaging Results:  Relevant Lab Results:   Additional Information SSN: [id number]  Maree KrabbeBridget A Shem Plemmons, LCSW

## 2018-02-06 NOTE — Progress Notes (Signed)
Procedure(s) (LRB): VIDEO ASSISTED THORACOSCOPY (VATS)/EMPYEMA (Left) Subjective: Scant L pleural drain output- pull today by IR Transition to po Augmentin for 10 days at home for streptococcus in empyema fluid I will follow in office with CXR Objective: Vital signs in last 24 hours: Temp:  [97.7 F (36.5 C)-98.6 F (37 C)] 98.3 F (36.8 C) (09/18 0930) Pulse Rate:  [70-81] 70 (09/18 0930) Cardiac Rhythm: Normal sinus rhythm (09/18 0900) BP: (107-143)/(62-115) 116/62 (09/18 0930) SpO2:  [96 %-97 %] 97 % (09/18 0930) Weight:  [154.8 kg] 154.8 kg (09/18 0300)  Hemodynamic parameters for last 24 hours:    Intake/Output from previous day: 09/17 0701 - 09/18 0700 In: 301.1 [IV Piggyback:301.1] Out: 2605 [Urine:2600; Drains:5] Intake/Output this shift: No intake/output data recorded.  Breath sounds clear Edema better  Lab Results: Recent Labs    02/04/18 0211 02/06/18 0411  WBC 5.8 5.0  HGB 12.1* 12.3*  HCT 39.8 40.1  PLT 319 290   BMET:  Recent Labs    02/05/18 0803 02/06/18 0411  NA 138 139  K 2.9* 2.8*  CL 93* 96*  CO2 31 28  GLUCOSE 132* 176*  BUN 22* 22*  CREATININE 1.38* 1.40*  CALCIUM 8.8* 8.4*    PT/INR: No results for input(s): LABPROT, INR in the last 72 hours. ABG    Component Value Date/Time   PHART 7.441 01/30/2018 0530   HCO3 33.3 (H) 01/30/2018 0530   O2SAT 90.0 01/30/2018 0530   CBG (last 3)  No results for input(s): GLUCAP in the last 72 hours.  Assessment/Plan: S/P Procedure(s) (LRB): VIDEO ASSISTED THORACOSCOPY (VATS)/EMPYEMA (Left) DC drain  Home in am after cxr   LOS: 9 days    Joshua Reilly 02/06/2018

## 2018-02-06 NOTE — Progress Notes (Signed)
Notified [facility] Craige CottaKirby of K+ level 2.8

## 2018-02-06 NOTE — Progress Notes (Signed)
Referring Physician(s): * No referring provider recorded for this case *  Supervising Physician: Oley Balm  Patient Status:  [facility] - In-pt  Chief Complaint: L empyema s/p chest tube placement 01/30/18  Subjective: Patient anticipating chest tube removal. States he is feeling much better overall.   Allergies: Patient has no known allergies.  Medications: Prior to Admission medications   Medication Sig Start Date End Date Taking? Authorizing Provider  amLODipine (NORVASC) 5 MG tablet Take 10 mg by mouth daily. 01/14/18  Yes [provider]  aspirin 325 MG tablet Take 325 mg by mouth daily.   Yes [provider]  cephALEXin (KEFLEX) 500 MG capsule Take 500 mg by mouth 3 (three) times daily. 01/18/18  Yes [provider]  furosemide (LASIX) 40 MG tablet Take 40 mg by mouth daily. 01/23/18  Yes [provider]  lamoTRIgine (LAMICTAL) 100 MG tablet Take 100 mg by mouth at bedtime. 01/14/18  Yes [provider]  levothyroxine (SYNTHROID, LEVOTHROID) 75 MCG tablet Take 75 mcg by mouth daily. 01/14/18  Yes [provider]  LORazepam (ATIVAN) 0.5 MG tablet Take 0.5 mg by mouth every 8 (eight) hours as needed for anxiety.    Yes [provider]  Omega-3 Fatty Acids (FISH OIL) 1000 MG CAPS Take 1,000 mg by mouth daily.   Yes [provider]  potassium chloride (K-DUR,KLOR-CON) 10 MEQ tablet Take 10 mEq by mouth daily. 01/14/18  Yes [provider]  rivastigmine (EXELON) 4.5 MG capsule Take 4.5 mg by mouth at bedtime. 01/14/18  Yes [provider]  venlafaxine XR (EFFEXOR-XR) 75 MG 24 hr capsule Take 75 mg by mouth daily. 01/14/18  Yes [provider]     Vital Signs: BP 116/62   Pulse 70   Temp 98.3 F (36.8 C) (Oral)   Resp 16   Ht 5\' 6"  (1.676 m)   Wt (!) 341 lb 4.4 oz (154.8 kg)   SpO2 97%   BMI 55.08 kg/m   Physical Exam  NAD, alert, comfortably sitting in bed Chest: no acute  distress, clear to auscultation, L chest tube in place with trace pleural fluid in Pleurvac, to seal, no air leak noted.   Imaging: Dg Chest Port 1 View  Result Date: 02/06/2018 CLINICAL DATA:  Status post chest tube EXAM: PORTABLE CHEST 1 VIEW COMPARISON:  February 02, 2018 FINDINGS: No evident pneumothorax. There is a left pleural effusion with left base atelectatic change. Lungs elsewhere are clear. Heart is mildly enlarged with pulmonary vascularity normal. No adenopathy. No bone lesions. IMPRESSION: No pneumothorax. Left pleural effusion with left base atelectasis. Lungs elsewhere clear. Stable cardiac prominence. Electronically Signed   By: Bretta Bang III M.D.   On: 02/06/2018 16:26    Labs:  CBC: Recent Labs    02/01/18 0729 02/02/18 0907 02/04/18 0211 02/06/18 0411  WBC 7.9 5.1 5.8 5.0  HGB 13.2 12.7* 12.1* 12.3*  HCT 42.8 41.6 39.8 40.1  PLT 343 323 319 290    COAGS: Recent Labs    01/30/18 0440  INR 1.33    BMP: Recent Labs    02/03/18 0244 02/04/18 0211 02/05/18 0803 02/06/18 0411  NA 137 138 138 139  K 2.8* 2.7* 2.9* 2.8*  CL 93* 95* 93* 96*  CO2 28 27 31 28   GLUCOSE 138* 122* 132* 176*  BUN 24* 25* 22* 22*  CALCIUM 8.7* 8.6* 8.8* 8.4*  CREATININE 1.49* 1.43* 1.38* 1.40*  GFRNONAA 58* >60 >60 >60  GFRAA >  60 >60 >60 >60    LIVER FUNCTION TESTS: Recent Labs    01/29/18 0814  BILITOT 0.5  AST 18  ALT 12  ALKPHOS 135*  PROT 6.4*  ALBUMIN 2.3*    Assessment and Plan: Empyema s/p chest tube placement 01/30/18 Patient with improvement since chest tube placed 1 week ago.  Has been followed by TCTS.  Per Dr. Donata ClayVan Trigt, ok to pull chest tube.  PA to bedside.  Chest tube pulled in its entirety without complication.  Patient tolerated well.   Electronically Signed: Hoyt KochKacie Sue-Ellen Orley Lawry, PA 02/06/2018, 4:38 PM   I spent a total of 15 Minutes at the the patient's bedside AND on the patient's hospital floor or unit, greater than 50% of  which was counseling/coordinating care for empyema.

## 2018-02-06 NOTE — Progress Notes (Signed)
PROGRESS NOTE    Joshua Reilly  FAO:[id number] DOB: Sep 23, 1980 DOA: 01/28/2018 PCP: Raelyn Number, MD    Brief Narrative: 37 year old male, resident of an ALF, PMH of Down syndrome, pulmonary stenosis, chronic CHF, OSA  CPAP, not on home oxygen, abdominal hernia, as per history provided by father, patient was hospitalized sometime in 2018 and had 2 chest tubes placed on the right side which drained thick fluid, underwent left-sided thoracentesis in June at [facility], now transferred from [facility] on 9/10 with a couple of weeks history of progressive lower extremity edema and dyspnea on mild exertion.  He has had low-grade fevers but no cough.  Evaluation at [facility] showed CTA negative for PE, loculated left pleural effusion concerning for empyema.  As per family, lower extremity Dopplers a week ago at ALF reportedly negative for DVT.  Lasix dose recently increased as outpatient.  Patient sees pulmonologist Dr. Alcide Clever who evaluated him at [facility] ED and recommended admission to a tertiary cancer center for evaluation by CTS surgery, consideration for VATS.  Patient was thereby transferred to [facility]. Regarding Left side empyema, patient is consider high risk for surgery. Dr Prescott Gum recommend chest tube placement by IR. Patient underwent chest tube placement on 9-12. Plan is to continue with IV antibiotics until  discharge. Follow culture to determine oral antibiotics for discharge. Goal is for less  30 cc or less  for two consecutives days from chest tube to consider discontinuation of chest tube.    Assessment & Plan:    Empyema/Loculated left pleural effusion;  -treated with broad-spectrum IV antibiotics initially -CTS consulted -Underwent chest tube placement by dementia radiology and 9/12 -Fluid culture with strep viridans -Chest tube output significantly improved, plan to remove chest tube today, follow-up chest x-ray tomorrow -Transitioned to oral antibiotics at  discharge -Follow-up chest x-ray in 2-3 weeks  Acute in chronic diastolic Heart failure exacerbation.  Pulmonary stenosis;  -echo noted diastolic dysfunction -Followed by cardiology, clinically euvolemic -Continue oral Lasix, potassium supplementation  Hypokalemia; getting 40 meq TID.  -give extra KCl, repeat B met this evening and in a.m.  OSA;  Apparently recently diagnosed and awaiting CPAP.  Follows with pulmonology in [place].  Morbid obesity/Body mass index is 41.87 kg/m.  Down syndrome: Mental status at baseline.  Anemia; follow trend.  Hb stable.   DVT prophylaxis: Lovenox Code Status; full code.  Family Communication: father at bedside.  Disposition Plan: home when stable.    Consultants:   CVTS  Cardiology    Procedures:   Chest tube  ECHO   Antimicrobials:   Zosyn    Subjective: -no complaints, feels well  Objective: Vitals:   02/05/18 1739 02/05/18 2339 02/06/18 0300 02/06/18 0930  BP: (!) 143/115 107/64  116/62  Pulse: 81 77  70  Resp:      Temp: 97.7 F (36.5 C) 98.6 F (37 C)  98.3 F (36.8 C)  TempSrc: Oral Oral  Oral  SpO2: 96% 96%  97%  Weight:   (!) 154.8 kg   Height:        Intake/Output Summary (Last 24 hours) at 02/06/2018 1533 Last data filed at 02/06/2018 0600 Gross per 24 hour  Intake 0 ml  Output 605 ml  Net -605 ml   Filed Weights   02/04/18 0814 02/05/18 0500 02/06/18 0300  Weight: (!) 156 kg (!) 155.9 kg (!) 154.8 kg    Examination:  Gen: Awake, Alert, Oriented X 3, obese male with mild cognitive dysfunction  HEENT: PERRLA, Neck supple, no JVD Lungs: and decreased breath sounds left base, chest tube noted CVS: RRR,No Gallops,Rubs or new Murmurs Abd: soft, Non tender, non distended, BS present Extremities: No Cyanosis, Clubbing or edema Skin: no new rashes    Data Reviewed: I have personally reviewed following labs and imaging studies  CBC: Recent Labs  Lab 01/31/18 0414 02/01/18 0729  02/02/18 0907 02/04/18 0211 02/06/18 0411  WBC 6.3 7.9 5.1 5.8 5.0  HGB 12.4* 13.2 12.7* 12.1* 12.3*  HCT 41.1 42.8 41.6 39.8 40.1  MCV 95.1 95.3 95.2 94.8 96.4  PLT 348 343 323 319 110   Basic Metabolic Panel: Recent Labs  Lab 02/02/18 0907 02/03/18 0244 02/04/18 0211 02/05/18 0803 02/06/18 0411  NA 135 137 138 138 139  K 3.0* 2.8* 2.7* 2.9* 2.8*  CL 92* 93* 95* 93* 96*  CO2 29 28 27 31 28   GLUCOSE 185* 138* 122* 132* 176*  BUN 21* 24* 25* 22* 22*  CREATININE 1.44* 1.49* 1.43* 1.38* 1.40*  CALCIUM 8.3* 8.7* 8.6* 8.8* 8.4*  MG  --   --  2.0  --   --    GFR: Estimated Creatinine Clearance: 100.4 mL/min (A) (by C-G formula based on SCr of 1.4 mg/dL (H)). Liver Function Tests: No results for input(s): AST, ALT, ALKPHOS, BILITOT, PROT, ALBUMIN in the last 168 hours. No results for input(s): LIPASE, AMYLASE in the last 168 hours. No results for input(s): AMMONIA in the last 168 hours. Coagulation Profile: No results for input(s): INR, PROTIME in the last 168 hours. Cardiac Enzymes: No results for input(s): CKTOTAL, CKMB, CKMBINDEX, TROPONINI in the last 168 hours. BNP (last 3 results) No results for input(s): PROBNP in the last 8760 hours. HbA1C: No results for input(s): HGBA1C in the last 72 hours. CBG: No results for input(s): GLUCAP in the last 168 hours. Lipid Profile: No results for input(s): CHOL, HDL, LDLCALC, TRIG, CHOLHDL, LDLDIRECT in the last 72 hours. Thyroid Function Tests: No results for input(s): TSH, T4TOTAL, FREET4, T3FREE, THYROIDAB in the last 72 hours. Anemia Panel: No results for input(s): VITAMINB12, FOLATE, FERRITIN, TIBC, IRON, RETICCTPCT in the last 72 hours. Sepsis Labs: No results for input(s): PROCALCITON, LATICACIDVEN in the last 168 hours.  Recent Results (from the past 240 hour(s))  MRSA PCR Screening     Status: Abnormal   Collection Time: 01/29/18  2:24 AM  Result Value Ref Range Status   MRSA by PCR POSITIVE (A) NEGATIVE Final     Comment:        The GeneXpert MRSA Assay (FDA approved for NASAL specimens only), is one component of a comprehensive MRSA colonization surveillance program. It is not intended to diagnose MRSA infection nor to guide or monitor treatment for MRSA infections. RESULT CALLED TO, READ BACK BY AND VERIFIED WITHShade Flood RN [phone] 01/29/18 A BROWNING Performed at [facility], [place] [address] [place], [place] [address]   Surgical pcr screen     Status: None   Collection Time: 01/29/18  4:08 PM  Result Value Ref Range Status   MRSA, PCR NEGATIVE NEGATIVE Final   Staphylococcus aureus NEGATIVE NEGATIVE Final    Comment: (NOTE) The Xpert SA Assay (FDA approved for NASAL specimens in patients 85 years of age and older), is one component of a comprehensive surveillance program. It is not intended to diagnose infection nor to guide or monitor treatment. Performed at [facility], [place] [address], [place], [place] [address]   Aerobic/Anaerobic Culture (  surgical/deep wound)     Status: None   Collection Time: 01/30/18  4:49 PM  Result Value Ref Range Status   Specimen Description PLEURAL  Final   Special Requests NONE  Final   Gram Stain   Final    ABUNDANT WBC PRESENT, PREDOMINANTLY PMN MODERATE GRAM POSITIVE COCCI    Culture   Final    RARE VIRIDANS STREPTOCOCCUS NO ANAEROBES ISOLATED Performed at [facility], [address], [place],  [address]    Report Status 02/05/2018 FINAL  Final         Radiology Studies: No results found.      Scheduled Meds: . enoxaparin (LOVENOX) injection  80 mg Subcutaneous Q24H  . furosemide  40 mg Oral BID  . lamoTRIgine  100 mg Oral QHS  . levothyroxine  75 mcg Oral QAC breakfast  . potassium chloride  40 mEq Oral TID  . rivastigmine  4.5 mg Oral QHS  . sodium chloride flush  5 mL Intracatheter Q8H  . venlafaxine XR  75 mg Oral Q breakfast   Continuous Infusions: . sodium chloride 10 mL/hr at 01/30/18 1351   . piperacillin-tazobactam (ZOSYN)  IV 3.375 g (02/06/18 1252)     LOS: 9 days    Time spent: 25 minutes.     Domenic Polite, MD [facility] Page via [email or web address]  If 7PM-7AM, please contact night-coverage [email or web address] Password [facility] 02/06/2018, 3:33 PM

## 2018-02-06 NOTE — Progress Notes (Signed)
Pharmacy signing off Zosyn consult but will continue to follow peripherally.  Loura BackJennifer Arkport, PharmD, BCPS Clinical Pharmacist Clinical phone for 02/06/2018 until 3p is [phone] Please check AMION for all Pharmacist numbers by unit 02/06/2018 1:52 PM

## 2018-02-07 ENCOUNTER — Inpatient Hospital Stay (HOSPITAL_COMMUNITY): Payer: Medicare Other

## 2018-02-07 LAB — BASIC METABOLIC PANEL
Anion gap: 14 (ref 5–15)
BUN: 19 mg/dL (ref 6–20)
CO2: 29 mmol/L (ref 22–32)
Calcium: 8.6 mg/dL — ABNORMAL LOW (ref 8.9–10.3)
Chloride: 95 mmol/L — ABNORMAL LOW (ref 98–111)
Creatinine, Ser: 1.31 mg/dL — ABNORMAL HIGH (ref 0.61–1.24)
GFR calc Af Amer: >60 mL/min (ref >60)
Glucose, Bld: 125 mg/dL — ABNORMAL HIGH (ref 70–99)
POTASSIUM: 3.1 mmol/L — AB (ref 3.5–5.1)
SODIUM: 138 mmol/L (ref 135–145)

## 2018-02-07 MED ORDER — POTASSIUM CHLORIDE CRYS ER 20 MEQ PO TBCR: 40.0000 meq | EXTENDED_RELEASE_TABLET | Freq: Two times a day (BID) | ORAL | 1 refills | Status: AC

## 2018-02-07 MED ORDER — FUROSEMIDE 40 MG PO TABS: 40.0000 mg | ORAL_TABLET | Freq: Every day | ORAL | Status: DC

## 2018-02-07 MED ORDER — AMOXICILLIN-POT CLAVULANATE 875-125 MG PO TABS
1.0000 | ORAL_TABLET | Freq: Two times a day (BID) | ORAL | Status: DC
  Administered 2018-02-07: 1 via ORAL
  Filled 2018-02-07: qty 1

## 2018-02-07 MED ORDER — ACETAMINOPHEN 325 MG PO TABS: 650.0000 mg | ORAL_TABLET | Freq: Four times a day (QID) | ORAL | Status: AC | PRN

## 2018-02-07 MED ORDER — AMOXICILLIN-POT CLAVULANATE 875-125 MG PO TABS: 1.0000 | ORAL_TABLET | Freq: Two times a day (BID) | ORAL | 0 refills | Status: AC

## 2018-02-07 NOTE — Discharge Summary (Signed)
Physician Discharge Summary  Joshua Reilly XBJ:[id number] DOB: Oct 11, 1980 DOA: 01/28/2018  PCP: Joshua AmmonsHaque, Joshua P, MD  Admit date: 01/28/2018 Discharge date: 02/07/2018  Time spent: 35 minutes  Recommendations for Outpatient Follow-up:  [name] CVTS in 10days PCP in 1 week, please check Bmet to FU Potassium Dr.Katarina Reilly SeeNelson 9/24 at 10:30am  Discharge Diagnoses:  Principal Problem:   Empyema of left pleural space (HCC) Active Problems:   CHF (congestive heart failure) (HCC)   OSA (obstructive sleep apnea)   Dyspnea   Acute diastolic heart failure (HCC)   Chest tube in place   Hypokalemia   Pulmonary valve stenosis   CKD (chronic kidney disease), stage III [facility](HCC)   Discharge Condition: stable  Diet recommendation: low sodium heart healthy  Filed Weights   02/05/18 0500 02/06/18 0300 02/07/18 0500  Weight: (!) 155.9 kg (!) 154.8 kg (!) 155.9 kg    History of present illness:  37 year old male, resident of an ALF, PMH of Down syndrome, pulmonary stenosis, chronic CHF, OSA  CPAP, not on home oxygen, abdominal hernia, as per history provided by father, patient was hospitalized sometime in 2018 and had 2 chest tubes placed on the right side which drained thick fluid, underwent [facility]-sided [facility]acent[facility] June [facility], now [facility] on 9/10 with a couple of weeks history of progressive lower extremity edema and dyspnea on mild exertion. He has had low-grade fevers but no cough. [facility] showed CTA negative for PE, loculated left pleural effusion concerning for empyema  Hospital Course:   Empyema/Loculated left pleural effusion;  -treated with broad-spectrum IV antibiotics initially -CVTS consulted -Underwent chest tube placement by IR on 9/12 -Fluid culture with strep viridans -Chest tube output significantly improved, chest tube removed yesterday 9/18, follow-up chest x-ray stable -Transitioned to Augementin at  discharge x10days -Follow-up with [name]  In 10days  Acute in chronic diastolic Heart failure exacerbation.  Pulmonary stenosis;  -echo noted diastolic dysfunction -Followed by cardiology, clinically euvolemic -Continue oral Lasix, potassium supplementation  Hypokalemia;  -due to diuretics, improving, increase KCL at discharge, check Bmet in 1 week  OSA;  Apparently recently diagnosed and awaiting CPAP. Follows with pulmonology in [place].  Morbid obesity/Body mass index is 41.87 kg/m.  Down syndrome: Mental status at baseline.  Anemia; follow trend.  Hb stable.    Procedures:  Chest tube by IR  Consultations:  CVTS  IR  Cardiology  Discharge Exam: Vitals:   02/06/18 2303 02/07/18 0746  BP: 123/67 115/60  Pulse: 75 78  Resp: 16 16  Temp: (!) 97.4 F (36.3 C) 98.2 F (36.8 C)  SpO2: 95% 100%    General: AAOx 2 Cardiovascular: S1S2/RRR Respiratory: CTAB  Discharge Instructions   Discharge Instructions    Diet - low sodium heart healthy   Complete by:  As directed    Increase activity slowly   Complete by:  As directed      Allergies as of 02/07/2018   No Known Allergies     Medication List    STOP taking these medications   cephALEXin 500 MG capsule Commonly known as:  KEFLEX     TAKE these medications   acetaminophen 325 MG tablet Commonly known as:  TYLENOL Take 2 tablets (650 mg total) by mouth every 6 (six) hours as needed for mild pain (or Fever >/= 101).   amLODipine 5 MG tablet Commonly known as:  NORVASC Take 10 mg by mouth daily.   amoxicillin-clavulanate 875-125 MG tablet  Commonly known as:  AUGMENTIN Take 1 tablet by mouth 2 (two) times daily for 10 days.   aspirin 325 MG tablet Take 325 mg by mouth daily.   Fish Oil 1000 MG Caps Take 1,000 mg by mouth daily.   furosemide 40 MG tablet Commonly known as:  LASIX Take 40 mg by mouth daily.   lamoTRIgine 100 MG tablet Commonly known as:  LAMICTAL Take  100 mg by mouth at bedtime.   levothyroxine 75 MCG tablet Commonly known as:  SYNTHROID, LEVOTHROID Take 75 mcg by mouth daily.   LORazepam 0.5 MG tablet Commonly known as:  ATIVAN Take 0.5 mg by mouth every 8 (eight) hours as needed for anxiety.   potassium chloride SA 20 MEQ tablet Commonly known as:  K-DUR,KLOR-CON Take 2 tablets (40 mEq total) by mouth 2 (two) times daily. What changed:    medication strength  how much to take  when to take this   rivastigmine 4.5 MG capsule Commonly known as:  EXELON Take 4.5 mg by mouth at bedtime.   venlafaxine XR 75 MG 24 hr capsule Commonly known as:  EFFEXOR-XR Take 75 mg by mouth daily.      No Known Allergies Follow-up Information    Joshua Reilly, Joshua Arista, MD. Go in 10 day(s).   Specialty:  Cardiothoracic Surgery Why:  Office will call you Contact information: [address] [place] [address] [phone]        Joshua Ammons, MD. Schedule an appointment as soon as possible for a visit in 1 week(s).   Specialty:  Internal Medicine Why:  Dr. Ardelle Park will visit patient at the facilty  Contact information: [address] [place] [address] [phone]        Joshua Masson, MD Follow up.   Specialty:  Cardiology Why:  Appointment: February 12, 2018 10:30am Contact information: [address] [place] [address] [phone]            The results of significant diagnostics from this hospitalization (including imaging, microbiology, ancillary and laboratory) are listed below for reference.    Significant Diagnostic Studies: Dg Chest 2 View  Result Date: 02/07/2018 CLINICAL DATA:  History of chest tube. EXAM: CHEST - 2 VIEW COMPARISON:  02/06/2018.  02/03/1999 19. FINDINGS: Mediastinum hilar structures normal. Stable cardiomegaly. No pulmonary venous congestion. Persistent bibasilar atelectasis/infiltrates and left-sided pleural effusion/pleural thickening. No  pneumothorax. IMPRESSION: 1. Persistent bibasilar atelectasis/infiltrates and left-sided pleural effusion/thickening. No pneumothorax. 2.  Stable cardiomegaly.  No pulmonary venous congestion. Electronically Signed   By: Maisie Fus  Register   On: 02/07/2018 09:37   Dg Chest 2 View  Result Date: 02/02/2018 CLINICAL DATA:  Chest tube in place EXAM: CHEST - 2 VIEW COMPARISON:  Two days ago FINDINGS: Stable chest tube positioning at the left base where there is opacity from a fluid collection by CT. Low volumes. No visible pneumothorax. IMPRESSION: Unchanged pleural based opacity at the left base with superimposed percutaneous catheter. Electronically Signed   By: Marnee Spring M.D.   On: 02/02/2018 09:16   Dg Chest 2 View  Result Date: 01/31/2018 CLINICAL DATA:  Shortness of breath EXAM: CHEST - 2 VIEW COMPARISON:  01/29/18 FINDINGS: Cardiac shadow is prominent and accentuated by the frontal technique. The overall inspiratory effort is poor. There is a new drainage catheter in the left pleural space inferiorly with significant reduction in pleural effusion. Some left basilar atelectasis remains. No bony abnormality is seen. IMPRESSION: Reduction in  left pleural effusion following chest tube placement. Mild left basilar atelectasis remains. Electronically Signed   By: Alcide Clever M.D.   On: 01/31/2018 10:03   Dg Chest 2 View  Result Date: 01/29/2018 CLINICAL DATA:  Shortness of breath EXAM: CHEST - 2 VIEW COMPARISON:  CT chest of 01/28/2018 and chest x-ray of the same day FINDINGS: There is persistent opacity at the left lung base which compared to the CT appears to correlate with atelectasis and possible pneumonia as well as left pleural effusion. Elevation of left hemidiaphragm again is noted. The right lung is relatively clear. Mild cardiomegaly is stable. No bony abnormality is seen. IMPRESSION: Persistent opacity at the left lung base consistent with left pleural effusion, as well as left basilar  atelectasis and possible pneumonia. Electronically Signed   By: Dwyane Dee M.D.   On: 01/29/2018 09:21   Dg Chest Port 1 View  Result Date: 02/06/2018 CLINICAL DATA:  Status post chest tube EXAM: PORTABLE CHEST 1 VIEW COMPARISON:  February 02, 2018 FINDINGS: No evident pneumothorax. There is a left pleural effusion with left base atelectatic change. Lungs elsewhere are clear. Heart is mildly enlarged with pulmonary vascularity normal. No adenopathy. No bone lesions. IMPRESSION: No pneumothorax. Left pleural effusion with left base atelectasis. Lungs elsewhere clear. Stable cardiac prominence. Electronically Signed   By: Bretta Bang III M.D.   On: 02/06/2018 16:26   Ct Image Guided Drainage By Percutaneous Catheter  Result Date: 01/30/2018 CLINICAL DATA:  Left sub pulmonic empyema EXAM: CT GUIDED LEFT PLEURAL DRAIN PLACEMENT ANESTHESIA/SEDATION: Intravenous Fentanyl and Versed were administered as conscious sedation during continuous monitoring of the patient's level of consciousness and physiological / cardiorespiratory status by the radiology RN, with a total moderate sedation time of 14 minutes. PROCEDURE: The procedure, risks, benefits, and alternatives were explained to the patient and parent. Questions regarding the procedure were encouraged and answered. The parent understands and consents to the procedure. Patient placed right lateral decubitus. Limited axial scans through the thorax obtained. The left sub pulmonic collection was localized and an appropriate skin entry site was determined and marked. The operative field was prepped with chlorhexidinein a sterile fashion, and a sterile drape was applied covering the operative field. A sterile gown and sterile gloves were used for the procedure. Local anesthesia was provided with 1% Lidocaine. Under CT fluoroscopic guidance, a 19 gauge percutaneous entry needle was advanced into the sub pulmonic collection. Greenish purulent material could be  aspirated. An Amplatz guidewire advanced easily within the collection, its position confirmed on CT. Tract dilated to facilitate placement of a 12 French pigtail drain catheter, formed centrally within the collection. CT confirms appropriate positioning. Catheter secured externally with a externally with 0 Prolene suture and StatLock and placed a Pleur-evac drainage. 20 mL aspirate was sent for Gram stain and culture. The patient tolerated the procedure well. COMPLICATIONS: None immediate FINDINGS: Left loculated sub pulmonic collection was again localized. 12 French pigtail drain catheter placed. 20 mL of the purulent aspirate was sent for Gram stain and culture. IMPRESSION: 1. Technically successful left pleural drain catheter placement under CT guidance. Electronically Signed   By: Corlis Leak M.D.   On: 01/30/2018 17:02    Microbiology: Recent Results (from the past 240 hour(s))  MRSA PCR Screening     Status: Abnormal   Collection Time: 01/29/18  2:24 AM  Result Value Ref Range Status   MRSA by PCR POSITIVE (A) NEGATIVE Final    Comment:  The GeneXpert MRSA Assay (FDA approved for NASAL specimens only), is one component of a comprehensive MRSA colonization surveillance program. It is not intended to diagnose MRSA infection nor to guide or monitor treatment for MRSA infections. RESULT CALLED TO, READ BACK BY AND VERIFIED WITHLeta Baptist RN [phone] 01/29/18 A BROWNING Performed at [facility], [address] [place], [place] [address]   Surgical pcr screen     Status: None   Collection Time: 01/29/18  4:08 PM  Result Value Ref Range Status   MRSA, PCR NEGATIVE NEGATIVE Final   Staphylococcus aureus NEGATIVE NEGATIVE Final    Comment: (NOTE) The Xpert SA Assay (FDA approved for NASAL specimens in patients 28 years of age and older), is one component of a comprehensive surveillance program. It is not intended to diagnose infection nor to guide or monitor  treatment. Performed at [facility], [address], [place], [place] [address]   Aerobic/Anaerobic Culture (surgical/deep wound)     Status: None   Collection Time: 01/30/18  4:49 PM  Result Value Ref Range Status   Specimen Description PLEURAL  Final   Special Requests NONE  Final   Gram Stain   Final    ABUNDANT WBC PRESENT, PREDOMINANTLY PMN MODERATE GRAM POSITIVE COCCI    Culture   Final    RARE VIRIDANS STREPTOCOCCUS NO ANAEROBES ISOLATED Performed at [facility], [address], [place], [place] [address]    Report Status 02/05/2018 FINAL  Final     Labs: Basic Metabolic Panel: Recent Labs  Lab 02/04/18 0211 02/05/18 0803 02/06/18 0411 02/06/18 1716 02/07/18 0330  NA 138 138 139 139 138  K 2.7* 2.9* 2.8* 3.0* 3.1*  CL 95* 93* 96* 96* 95*  CO2 27 31 28 29 29   GLUCOSE 122* 132* 176* 142* 125*  BUN 25* 22* 22* 19 19  CREATININE 1.43* 1.38* 1.40* 1.36* 1.31*  CALCIUM 8.6* 8.8* 8.4* 8.6* 8.6*  MG 2.0  --   --   --   --    Liver Function Tests: No results for input(s): AST, ALT, ALKPHOS, BILITOT, PROT, ALBUMIN in the last 168 hours. No results for input(s): LIPASE, AMYLASE in the last 168 hours. No results for input(s): AMMONIA in the last 168 hours. CBC: Recent Labs  Lab 02/01/18 0729 02/02/18 0907 02/04/18 0211 02/06/18 0411  WBC 7.9 5.1 5.8 5.0  HGB 13.2 12.7* 12.1* 12.3*  HCT 42.8 41.6 39.8 40.1  MCV 95.3 95.2 94.8 96.4  PLT 343 323 319 290   Cardiac Enzymes: No results for input(s): CKTOTAL, CKMB, CKMBINDEX, TROPONINI in the last 168 hours. BNP: BNP (last 3 results) Recent Labs    01/29/18 0814  BNP 10.9    ProBNP (last 3 results) No results for input(s): PROBNP in the last 8760 hours.  CBG: No results for input(s): GLUCAP in the last 168 hours.     Signed:  Zannie Cove MD.  [facility] 02/07/2018, 11:52 AM

## 2018-02-07 NOTE — Progress Notes (Signed)
CT Surgery  CXR stable OK to DC patient today Po Augmentin 10 days Will f/u in office  Thrivent FinancialVan Trigt

## 2018-02-07 NOTE — Progress Notes (Signed)
[facility]. Anticipated discharge date: 02/07/18 Family notified: Blima DessertMichael Haines, father Transportation by: father will drive patient  Nurse to call report to [phone].   CSW signing off.  Abigail ButtsSusan Naavya Postma, LCSWA  Clinical Social Worker

## 2018-02-12 ENCOUNTER — Ambulatory Visit (INDEPENDENT_AMBULATORY_CARE_PROVIDER_SITE_OTHER): Payer: Medicare Other | Admitting: Physician Assistant

## 2018-02-12 ENCOUNTER — Encounter: Payer: Self-pay | Admitting: Physician Assistant

## 2018-02-12 ENCOUNTER — Telehealth: Payer: Self-pay | Admitting: *Deleted

## 2018-02-12 VITALS — BP 138/88 | HR 84 | Ht 66.0 in | Wt 352.8 lb

## 2018-02-12 DIAGNOSIS — G4733 Obstructive sleep apnea (adult) (pediatric): Secondary | ICD-10-CM | POA: Diagnosis not present

## 2018-02-12 DIAGNOSIS — N183 Chronic kidney disease, stage 3 unspecified: Secondary | ICD-10-CM

## 2018-02-12 DIAGNOSIS — I370 Nonrheumatic pulmonary valve stenosis: Secondary | ICD-10-CM

## 2018-02-12 DIAGNOSIS — I5032 Chronic diastolic (congestive) heart failure: Secondary | ICD-10-CM | POA: Insufficient documentation

## 2018-02-12 LAB — BASIC METABOLIC PANEL
BUN / CREAT RATIO: 14 (ref 9–20)
BUN: 15 mg/dL (ref 6–20)
CHLORIDE: 98 mmol/L (ref 96–106)
CO2: 26 mmol/L (ref 20–29)
CREATININE: 1.1 mg/dL (ref 0.76–1.27)
Calcium: 9 mg/dL (ref 8.7–10.2)
GFR calc Af Amer: 97 mL/min/{1.73_m2} (ref >59)
GFR calc non Af Amer: 84 mL/min/{1.73_m2} (ref >59)
GLUCOSE: 105 mg/dL — AB (ref 65–99)
Potassium: 3.9 mmol/L (ref 3.5–5.2)
Sodium: 141 mmol/L (ref 134–144)

## 2018-02-12 MED ORDER — ASPIRIN EC 81 MG PO TBEC: 81.0000 mg | DELAYED_RELEASE_TABLET | Freq: Every day | ORAL | Status: AC

## 2018-02-12 NOTE — Progress Notes (Signed)
Cardiology Office Note:    Date:  02/12/2018   ID:  Joshua Reilly, DOB 1981-02-08, MRN [id number]  PCP:  [name], Imran P, MD  Cardiologist:  Tobias AlexanderKatarina Nelson, MD   Electrophysiologist:  None   Referring MD: [name], Imran P, MD   Chief Complaint  Patient presents with  . Hospitalization Follow-up    empyema; CHF     History of Present Illness:    Joshua Darlingaul Buysse is a 37 y.o. male with Down syndrome, pulmonic stenosis, heart failure, sleep apnea, obesity.  He has had several admissions recently with left pleural effusion requiring thoracentesis.  He was transferred and admitted to [facility] 9/9-9/19 with empyema complicated by decompensated heart failure.  He underwent chest tube placement.  He was followed by cardiology.  Echocardiogram demonstrated just mild pulmonic stenosis with peak gradient of 23 mm nightly.  Ejection fraction was normal with moderate diastolic dysfunction volume status improved with IV Lasix.  Follow-up echo was recommended in 1 year.  Mr. Neta EhlersHains returns for follow-up.  He is here today with his father.  He resides in an assisted living facility.  Since discharge, he has been doing well.  He denies chest discomfort, significant shortness of breath, PND.  His lower extremity swelling is much improved.  He denies syncope.  He denies cough or fevers.  He has recently adjusted his diet is now limiting soft drinks as well as salt.  Prior CV studies:   The following studies were reviewed today:  Echo 01/30/18 EF 60-65, Gr 2 DD, normal RVSF, mild pulmonic stenosis (peak 23)  Past Medical History:  Diagnosis Date  . Abdominal hernia   . Chronic diastolic CHF (congestive heart failure) (HCC)    Echo 9/19: EF 60-65, Gr 2 DD, normal RVSF, mild pulmonic stenosis (peak 23)  . CKD (chronic kidney disease), stage III (HCC)   . Down's syndrome   . Morbid obesity (HCC)   . Pneumonia    several admissions in 2019 // empyema requiring CT placement 01/2018 (Dr. Donata ClayVan Trigt)  .  Pulmonary stenosis    mild by echo 9/19 (peak gradient 23 mmHg)  . S/P thoracentesis 10/2017 X 2  . Sleep apnea    followed by pulmonology in [place]   Surgical Hx: The patient  has a past surgical history that includes Chest tube insertion (Right, ~ 2016) and Club foot release (Bilateral, 1980).   Current Medications: Current Meds  Medication Sig  . acetaminophen (TYLENOL) 325 MG tablet Take 2 tablets (650 mg total) by mouth every 6 (six) hours as needed for mild pain (or Fever >/= 101).  Marland Kitchen. amLODipine (NORVASC) 5 MG tablet Take 10 mg by mouth daily.  Marland Kitchen. amoxicillin-clavulanate (AUGMENTIN) 875-125 MG tablet Take 1 tablet by mouth 2 (two) times daily for 10 days.  . furosemide (LASIX) 40 MG tablet Take 40 mg by mouth daily.  Marland Kitchen. lamoTRIgine (LAMICTAL) 100 MG tablet Take 100 mg by mouth at bedtime.  Marland Kitchen. levothyroxine (SYNTHROID, LEVOTHROID) 75 MCG tablet Take 75 mcg by mouth daily.  Marland Kitchen. LORazepam (ATIVAN) 0.5 MG tablet Take 0.5 mg by mouth every 8 (eight) hours as needed for anxiety.   . Omega-3 Fatty Acids (FISH OIL) 1000 MG CAPS Take 1,000 mg by mouth daily.  . potassium chloride SA (K-DUR,KLOR-CON) 20 MEQ tablet Take 2 tablets (40 mEq total) by mouth 2 (two) times daily.  . rivastigmine (EXELON) 4.5 MG capsule Take 4.5 mg by mouth at bedtime.  Marland Kitchen. venlafaxine XR (EFFEXOR-XR) 75 MG 24  hr capsule Take 75 mg by mouth daily.  . [DISCONTINUED] aspirin 325 MG tablet Take 325 mg by mouth daily.     Allergies:   Patient has no known allergies.   Social History   Tobacco Use  . Smoking status: Never Smoker  . Smokeless tobacco: Never Used  Substance Use Topics  . Alcohol use: Never    Frequency: Never  . Drug use: Never     Family Hx: The patient's family history is not on file. He was adopted.  ROS:   Please see the history of present illness.    ROS All other systems reviewed and are negative.   EKGs/Labs/Other Test Reviewed:    EKG:  EKG is   ordered today.  The ekg ordered today  demonstrates NSR, HR 84, QTc 444  Recent Labs: 01/29/2018: ALT 12; B Natriuretic Peptide 10.9 01/30/2018: TSH 5.145 02/04/2018: Magnesium 2.0 02/06/2018: Hemoglobin 12.3; Platelets 290 02/07/2018: BUN 19; Creatinine, Ser 1.31; Potassium 3.1; Sodium 138   Recent Lipid Panel No results found for: CHOL, TRIG, HDL, CHOLHDL, LDLCALC, LDLDIRECT  Physical Exam:    VS:  BP 138/88   Pulse 84   Ht 5\' 6"  (1.676 m)   Wt (!) 352 lb 12.8 oz (160 kg)   SpO2 94%   BMI 56.94 kg/m     Wt Readings from Last 3 Encounters:  02/12/18 (!) 352 lb 12.8 oz (160 kg)  02/07/18 (!) 343 lb 11.2 oz (155.9 kg)     Physical Exam  Constitutional: He is oriented to person, place, and time. No distress.  HENT:  Head: Normocephalic and atraumatic.  Eyes: No scleral icterus.  Neck: No thyromegaly present.  Cardiovascular: Normal rate and regular rhythm.  Murmur heard.  Systolic murmur is present with a grade of 2/6 at the upper left sternal border. Pulmonary/Chest: He has no rales.  Abdominal: Soft.  Musculoskeletal: He exhibits edema (trace bilat LE edema).  Lymphadenopathy:    He has no cervical adenopathy.  Neurological: He is alert and oriented to person, place, and time.  Skin: Skin is warm and dry.  Psychiatric: He has a normal mood and affect.    ASSESSMENT & PLAN:    Chronic diastolic CHF (congestive heart failure) (HCC) Volume status remains stable since DC from the hospital.  NYHA 2.  We discussed the importance of weighing daily and limiting salt.  Obtain follow up BMET today.  He does not need more than ASA 81 mg.  Decrease ASA 81 mg QD.    Pulmonary valve stenosis, unspecified etiology Mild by recent echo.  He needs a follow up echocardiogram in 01/2019.  CKD (chronic kidney disease), stage III (HCC) Obtain follow up BMET today.  OSA (obstructive sleep apnea) He is getting fitted for a CPAP.  He is followed by pulmonology in [place].  Morbid obesity (HCC) He has changed his diet and is  limiting sugar.     Dispo:  Return in about 6 months (around 08/13/2018) for Routine Follow Up w/ Dr. Delton See.   Medication Adjustments/Labs and Tests Ordered: Current medicines are reviewed at length with the patient today.  Concerns regarding medicines are outlined above.  Tests Ordered: Orders Placed This Encounter  Procedures  . Basic Metabolic Panel (BMET)  . EKG 12-Lead  . ECHOCARDIOGRAM COMPLETE   Medication Changes: Meds ordered this encounter  Medications  . aspirin EC 81 MG tablet    Sig: Take 1 tablet (81 mg total) by mouth daily.    Signed, Lorin Picket  [place] Dec  02/12/2018 12:45 PM    [facility] [address] [place], [place], [place]  [address] Phone: [phone]; Fax: [phone]

## 2018-02-12 NOTE — Telephone Encounter (Signed)
-----   Message from Beatrice LecherScott T Weaver, PA-C sent at 02/12/2018  5:34 PM EDT ----- All values are normal or within acceptable limits.   Medication changes / Follow up labs / Other changes or recommendations:    - Continue current medications and follow up as planned.  Tereso NewcomerScott Weaver, PA-C 02/12/2018 5:34 PM

## 2018-02-12 NOTE — Patient Instructions (Signed)
Medication Instructions:  1. DECREASE ASPIRIN TO 81 MG DAILY  Labwork: TODAY BMET  Testing/Procedures: Your physician has requested that you have an echocardiogram. Echocardiography is a painless test that uses sound waves to create images of your heart. It provides your doctor with information about the size and shape of your heart and how well your heart's chambers and valves are working. This procedure takes approximately one hour. There are no restrictions for this procedure. THIS IS TO BE DONE 01/2019    Follow-Up: 6 MIONTHS WEI DR. NELSON   Any Other Special Instructions Will Be Listed Below (If Applicable).     If you need a refill on your cardiac medications before your next appointment, please call your pharmacy.

## 2018-02-12 NOTE — Telephone Encounter (Signed)
Left message to go over lab results.  

## 2018-02-13 NOTE — Telephone Encounter (Signed)
I s/w Joshua Reilly at nursing facility where pt resides and that I will fax over lab results to fax # [phone].  I also s/w pt's father who has been notified of lab results and that results are being faxed to facility. Both the nurse and pt's father thanked me for the call.

## 2018-02-13 NOTE — Telephone Encounter (Signed)
-----   Message from Scott T Weaver, PA-C sent at 02/12/2018  5:34 PM EDT ----- All values are normal or within acceptable limits.   Medication changes / Follow up labs / Other changes or recommendations:    - Continue current medications and follow up as planned.  Scott Weaver, PA-C 02/12/2018 5:34 PM 

## 2018-03-05 ENCOUNTER — Other Ambulatory Visit: Payer: Self-pay | Admitting: Cardiothoracic Surgery

## 2018-03-05 DIAGNOSIS — Z8709 Personal history of other diseases of the respiratory system: Secondary | ICD-10-CM

## 2018-03-06 ENCOUNTER — Encounter: Payer: Self-pay | Admitting: Cardiothoracic Surgery

## 2018-03-06 ENCOUNTER — Ambulatory Visit (INDEPENDENT_AMBULATORY_CARE_PROVIDER_SITE_OTHER): Payer: Medicare Other | Admitting: Cardiothoracic Surgery

## 2018-03-06 ENCOUNTER — Other Ambulatory Visit: Payer: Self-pay

## 2018-03-06 ENCOUNTER — Ambulatory Visit
Admission: RE | Admit: 2018-03-06 | Discharge: 2018-03-06 | Disposition: A | Discharge status: Home / Self Care | Payer: Medicare Other | Source: Ambulatory Visit | Attending: Cardiothoracic Surgery | Admitting: Cardiothoracic Surgery

## 2018-03-06 VITALS — BP 108/58 | HR 92 | Resp 18 | Ht 66.0 in | Wt 360.0 lb

## 2018-03-06 DIAGNOSIS — J90 Pleural effusion, not elsewhere classified: Secondary | ICD-10-CM

## 2018-03-06 DIAGNOSIS — Z8709 Personal history of other diseases of the respiratory system: Secondary | ICD-10-CM

## 2018-03-06 DIAGNOSIS — I5032 Chronic diastolic (congestive) heart failure: Secondary | ICD-10-CM

## 2018-03-06 NOTE — Progress Notes (Signed)
PCP is Shelbie Ammons, MD Referring Provider is Elease Etienne, MD  Chief Complaint  Patient presents with  . Pleural Effusion    LEFT .Marland Kitchen..reassessment with CXR today    HPI: Patient returns for post hospital visit after being treated for left lower lobe pneumonia with empyema with IV antibiotics and chest tube placed in the left pulmonic space by interventional radiology.  Cultures of material removed from the pleural space were negative.  Patient is finished his course of oral antibiotics. He denies cough or shortness of breath or fever.  Chest x-ray today shows clearing of the left pleural process with minimal residual pleural thickening.  Pneumonia resolved.  Patient was seen by cardiology during the hospitalization for diastolic heart failure and was treated with Lasix diuresis.  He has follow-up with cardiology with a repeat echocardiogram in early 2020. Past Medical History:  Diagnosis Date  . Abdominal hernia   . Chronic diastolic CHF (congestive heart failure) (HCC)    Echo 9/19: EF 60-65, Gr 2 DD, normal RVSF, mild pulmonic stenosis (peak 23)  . CKD (chronic kidney disease), stage III (HCC)   . Down's syndrome   . Morbid obesity (HCC)   . Pneumonia    several admissions in 2019 // empyema requiring CT placement 01/2018 (Dr. [name])  . Pulmonary stenosis    mild by echo 9/19 (peak gradient 23 mmHg)  . S/P thoracentesis 10/2017 X 2  . Sleep apnea    followed by pulmonology in [place]    Past Surgical History:  Procedure Laterality Date  . CHEST TUBE INSERTION Right ~ 2016  . CLUB FOOT RELEASE Bilateral 1980   "heel cords tightened"    Family History  Adopted: Yes    Social History Social History   Tobacco Use  . Smoking status: Never Smoker  . Smokeless tobacco: Never Used  Substance Use Topics  . Alcohol use: Never    Frequency: Never  . Drug use: Never    Current Outpatient Medications  Medication Sig Dispense Refill  . acetaminophen (TYLENOL)  325 MG tablet Take 2 tablets (650 mg total) by mouth every 6 (six) hours as needed for mild pain (or Fever >/= 101).    Marland Kitchen amLODipine (NORVASC) 5 MG tablet Take 10 mg by mouth daily.    Marland Kitchen aspirin EC 81 MG tablet Take 1 tablet (81 mg total) by mouth daily.    . ergocalciferol (VITAMIN D2) 50000 units capsule Take 50,000 Units by mouth once a week. EVERY FRIDAY    . furosemide (LASIX) 20 MG tablet Take 20 mg by mouth daily. AT 1 PM    . lamoTRIgine (LAMICTAL) 100 MG tablet Take 100 mg by mouth at bedtime.    Marland Kitchen levothyroxine (SYNTHROID, LEVOTHROID) 75 MCG tablet Take 75 mcg by mouth daily.    . Omega-3 Fatty Acids (FISH OIL) 1000 MG CAPS Take 1,000 mg by mouth daily.    . potassium chloride SA (K-DUR,KLOR-CON) 20 MEQ tablet Take 2 tablets (40 mEq total) by mouth 2 (two) times daily. 90 tablet 1  . rivastigmine (EXELON) 4.5 MG capsule Take 4.5 mg by mouth at bedtime.    Marland Kitchen venlafaxine XR (EFFEXOR-XR) 75 MG 24 hr capsule Take 75 mg by mouth daily.     No current facility-administered medications for this visit.     No Known Allergies  Review of Systems  Good appetite Sleeping well No significant pain  BP (!) 108/58 (BP Location: Left Arm, Patient Position: Sitting, Cuff Size:  Large)   Pulse 92   Resp 18   Ht 5\' 6"  (1.676 m)   Wt (!) 360 lb (163.3 kg)   SpO2 92% Comment: ON RA  BMI 58.11 kg/m  Physical Exam Obese young male no acute distress accompanied by father Breath sounds clear bilaterally Left chest tube insertion site healed Heart rate regular without murmur Stable chronic 1-2+ pedal edema  Diagnostic Tests: Chest x-ray image personally reviewed- left lower lobe loculated effusion-empyema is cleared  Impression: Resolved left empyema.  Plan: No further imaging or thoracic surgical intervention needed.  Patient has received a influenza vaccine while hospitalized.  I recommended to his father that antibiotic should be started early if he develops significant upper respiratory  infection with purulent sputum, wet cough.  Return as needed.   Mikey Bussing, MD [facility] [phone]

## 2019-04-08 IMAGING — DX DG CHEST 2V
2 series · 2 of 2 positions shown · non-contrast
Comparison: 01/29/18

CLINICAL DATA: Shortness of breath

EXAM:
CHEST - 2 VIEW

[x chest ap]
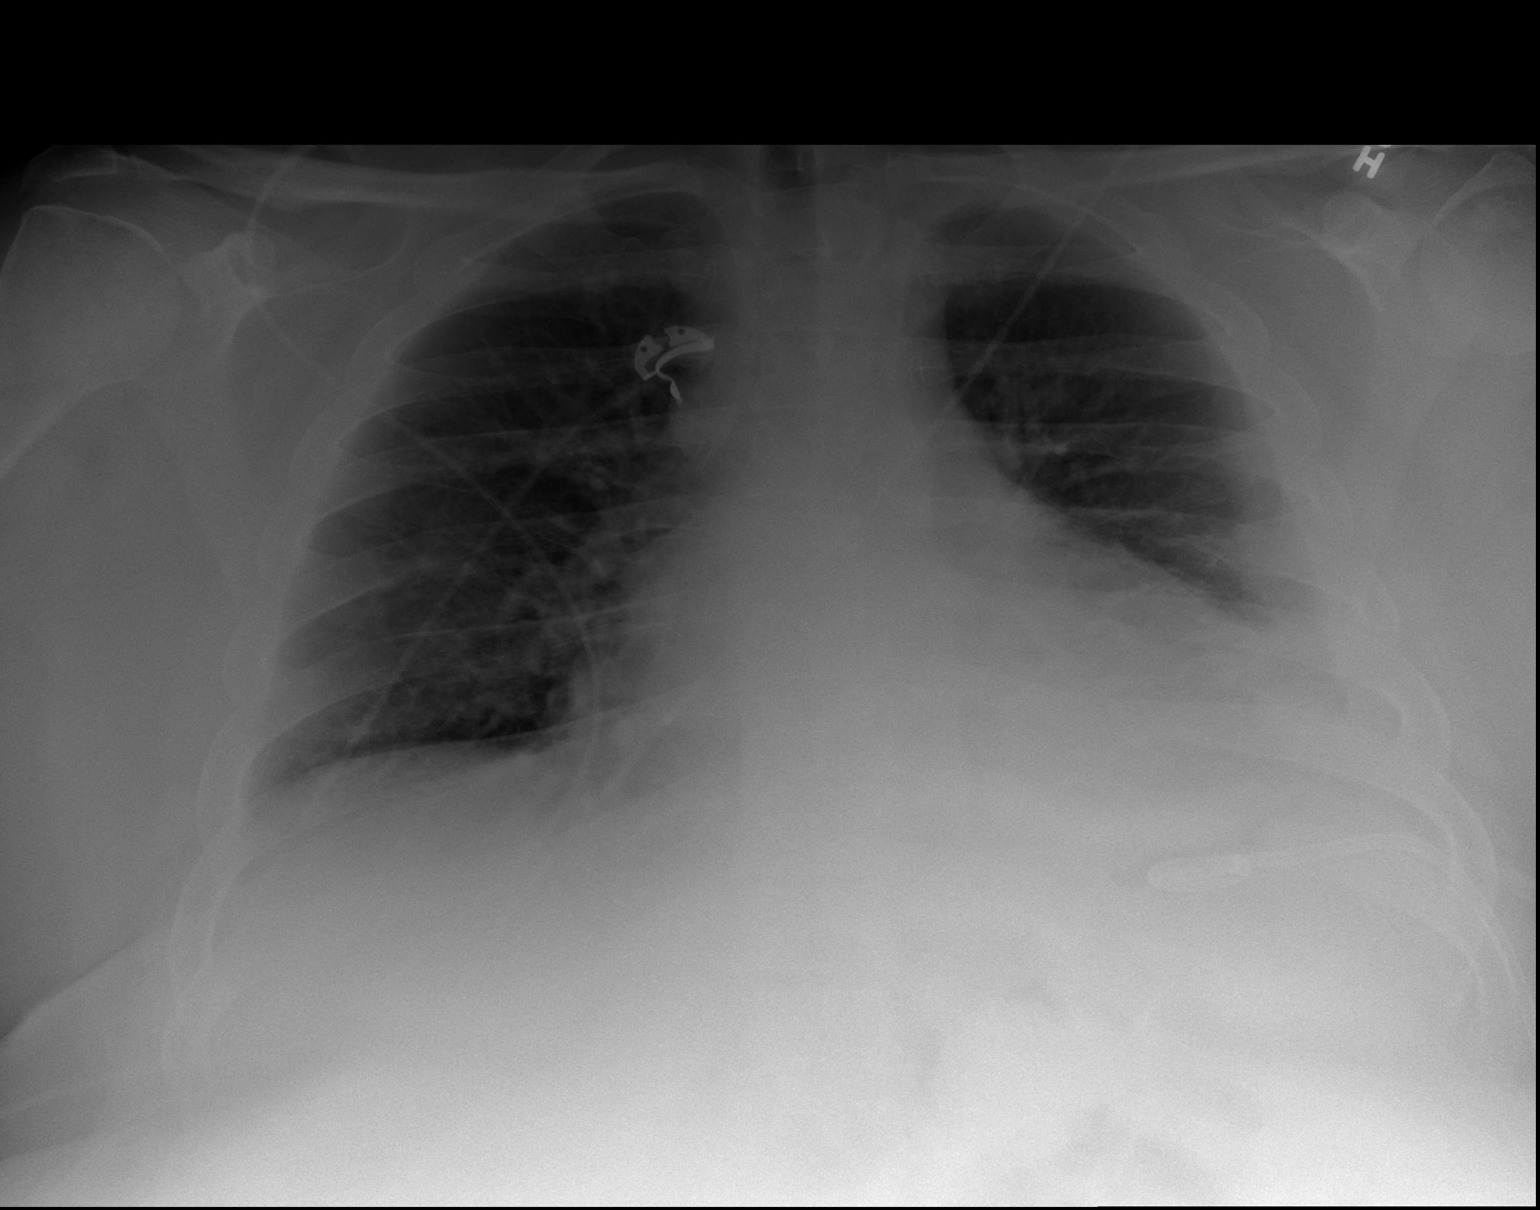

[w chest lat]
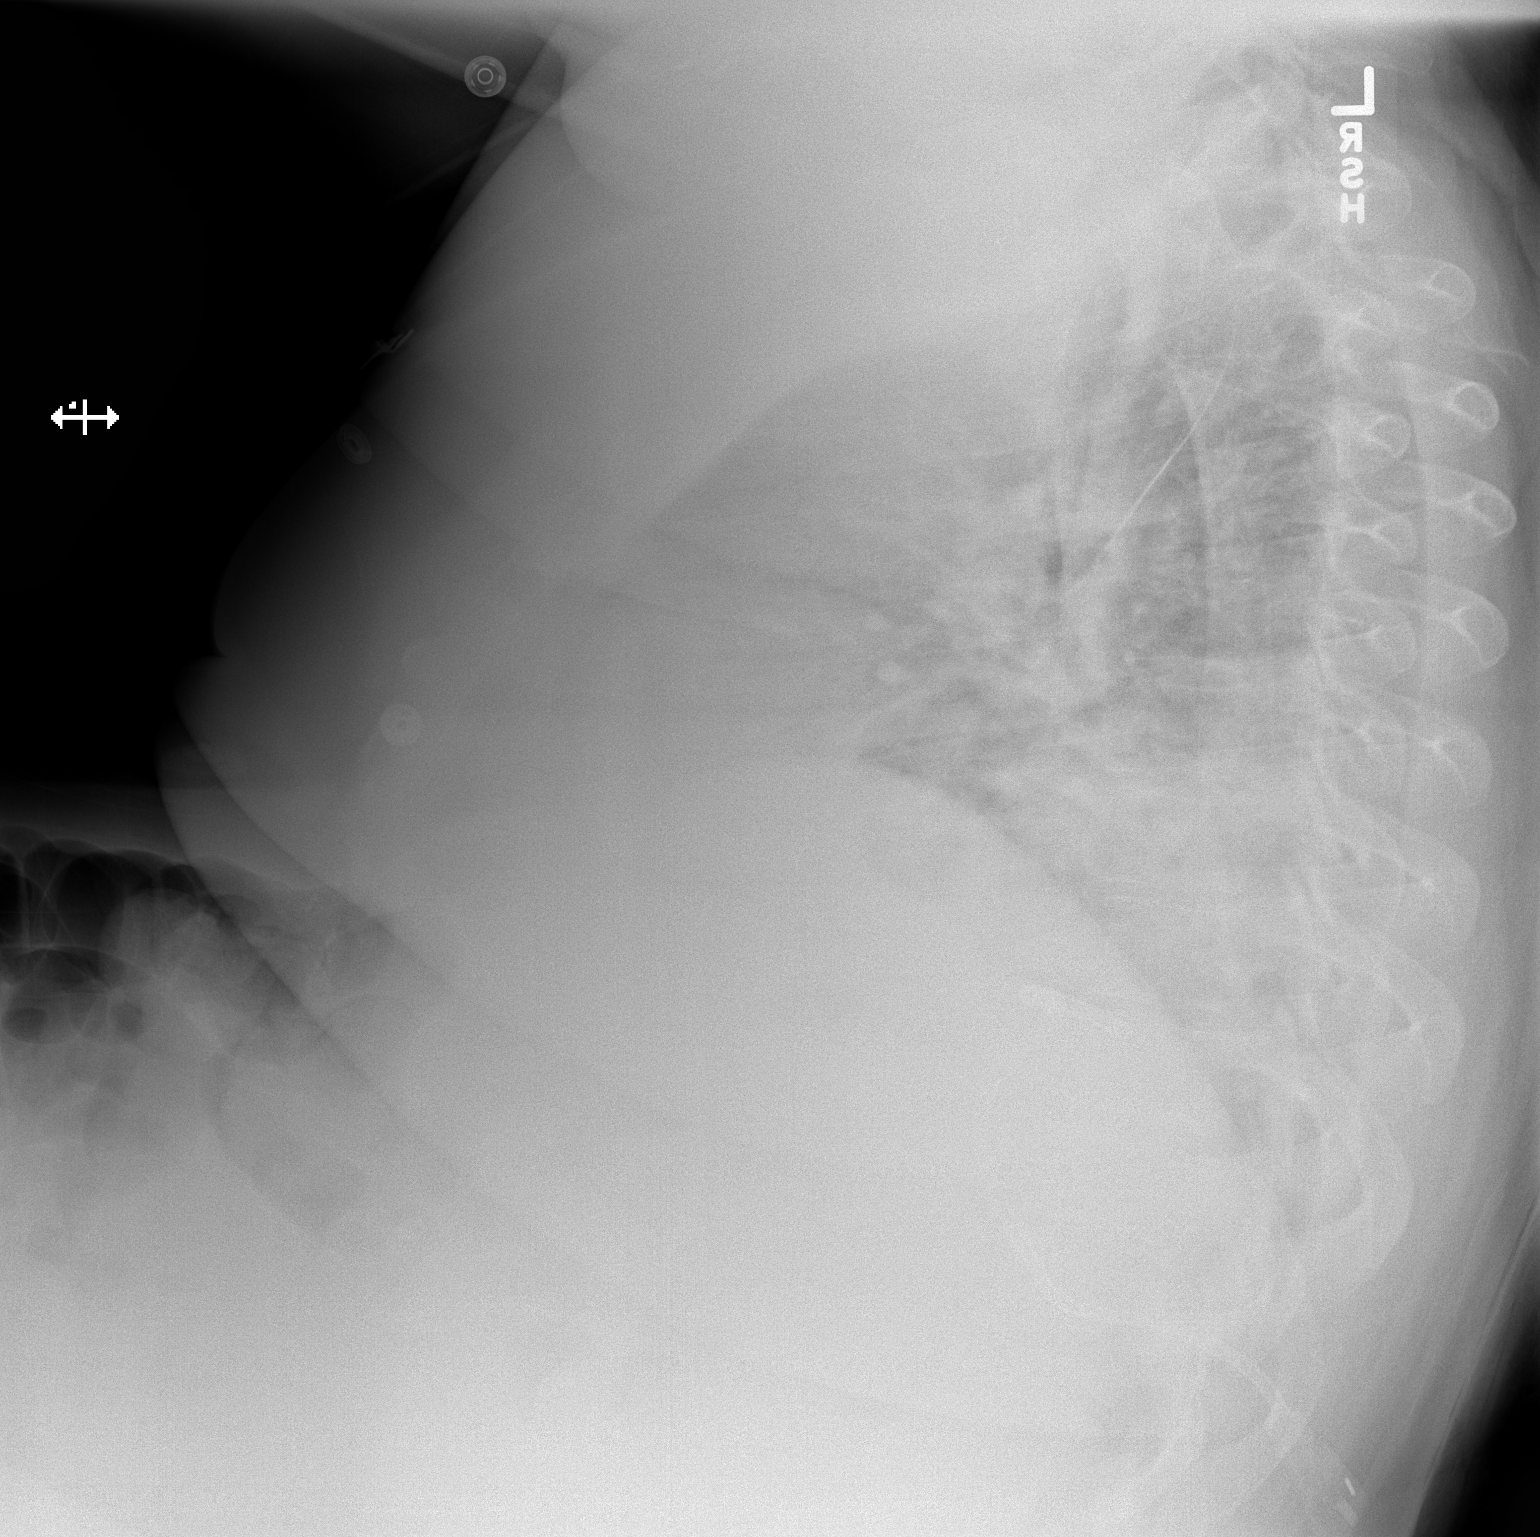

[2 of 2 positions shown; findings below may reference images not displayed]

FINDINGS: Cardiac shadow is prominent and accentuated by the frontal
technique. The overall inspiratory effort is poor. There is a new
drainage catheter in the left pleural space inferiorly with
significant reduction in pleural effusion. Some left basilar
atelectasis remains. No bony abnormality is seen.
IMPRESSION: Reduction in left pleural effusion following chest tube placement.
Mild left basilar atelectasis remains.

## 2019-04-10 IMAGING — DX DG CHEST 2V
2 series · 2 of 2 positions shown · non-contrast
Comparison: Two days ago

CLINICAL DATA: Chest tube in place

EXAM:
CHEST - 2 VIEW

[w chest lat]
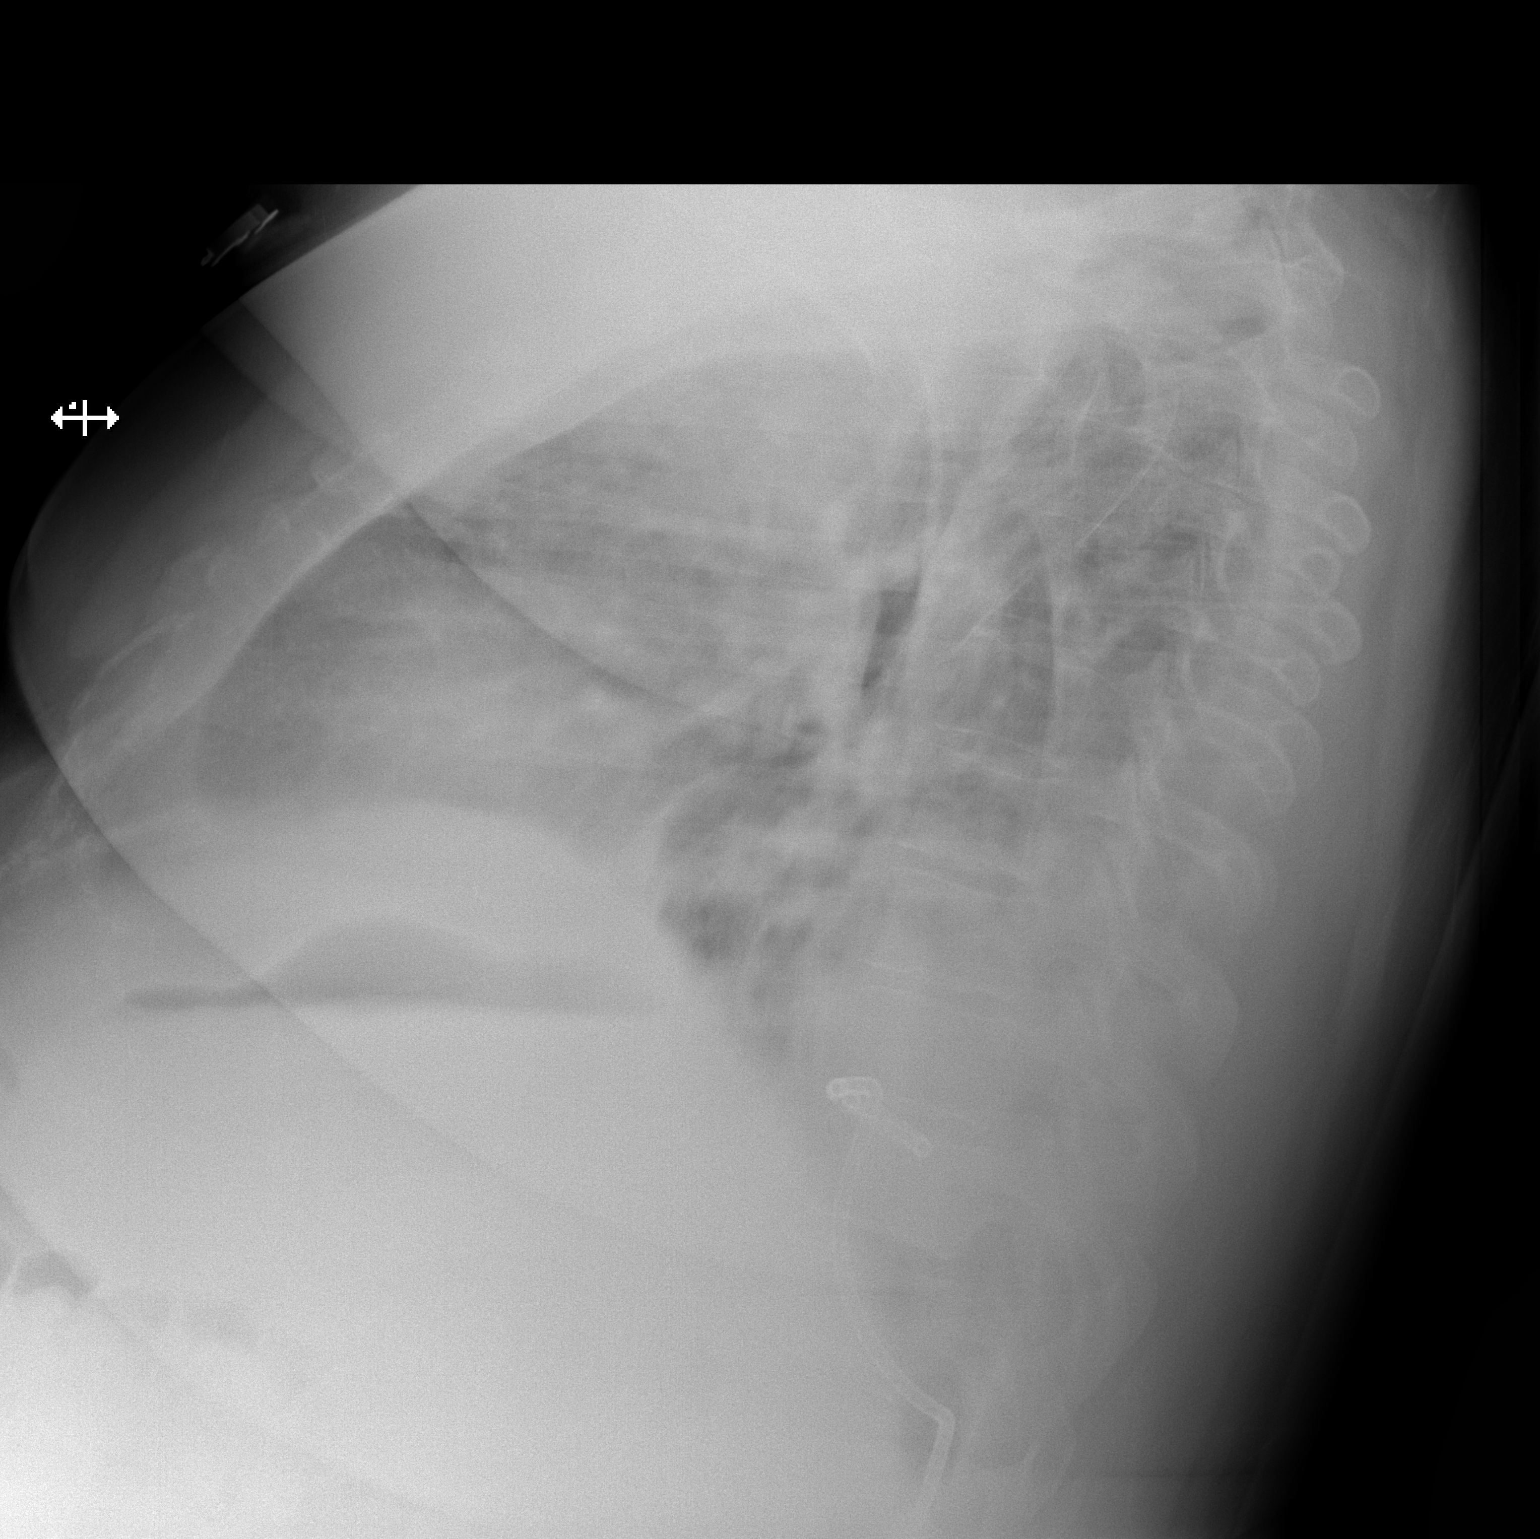

[x chest ap]
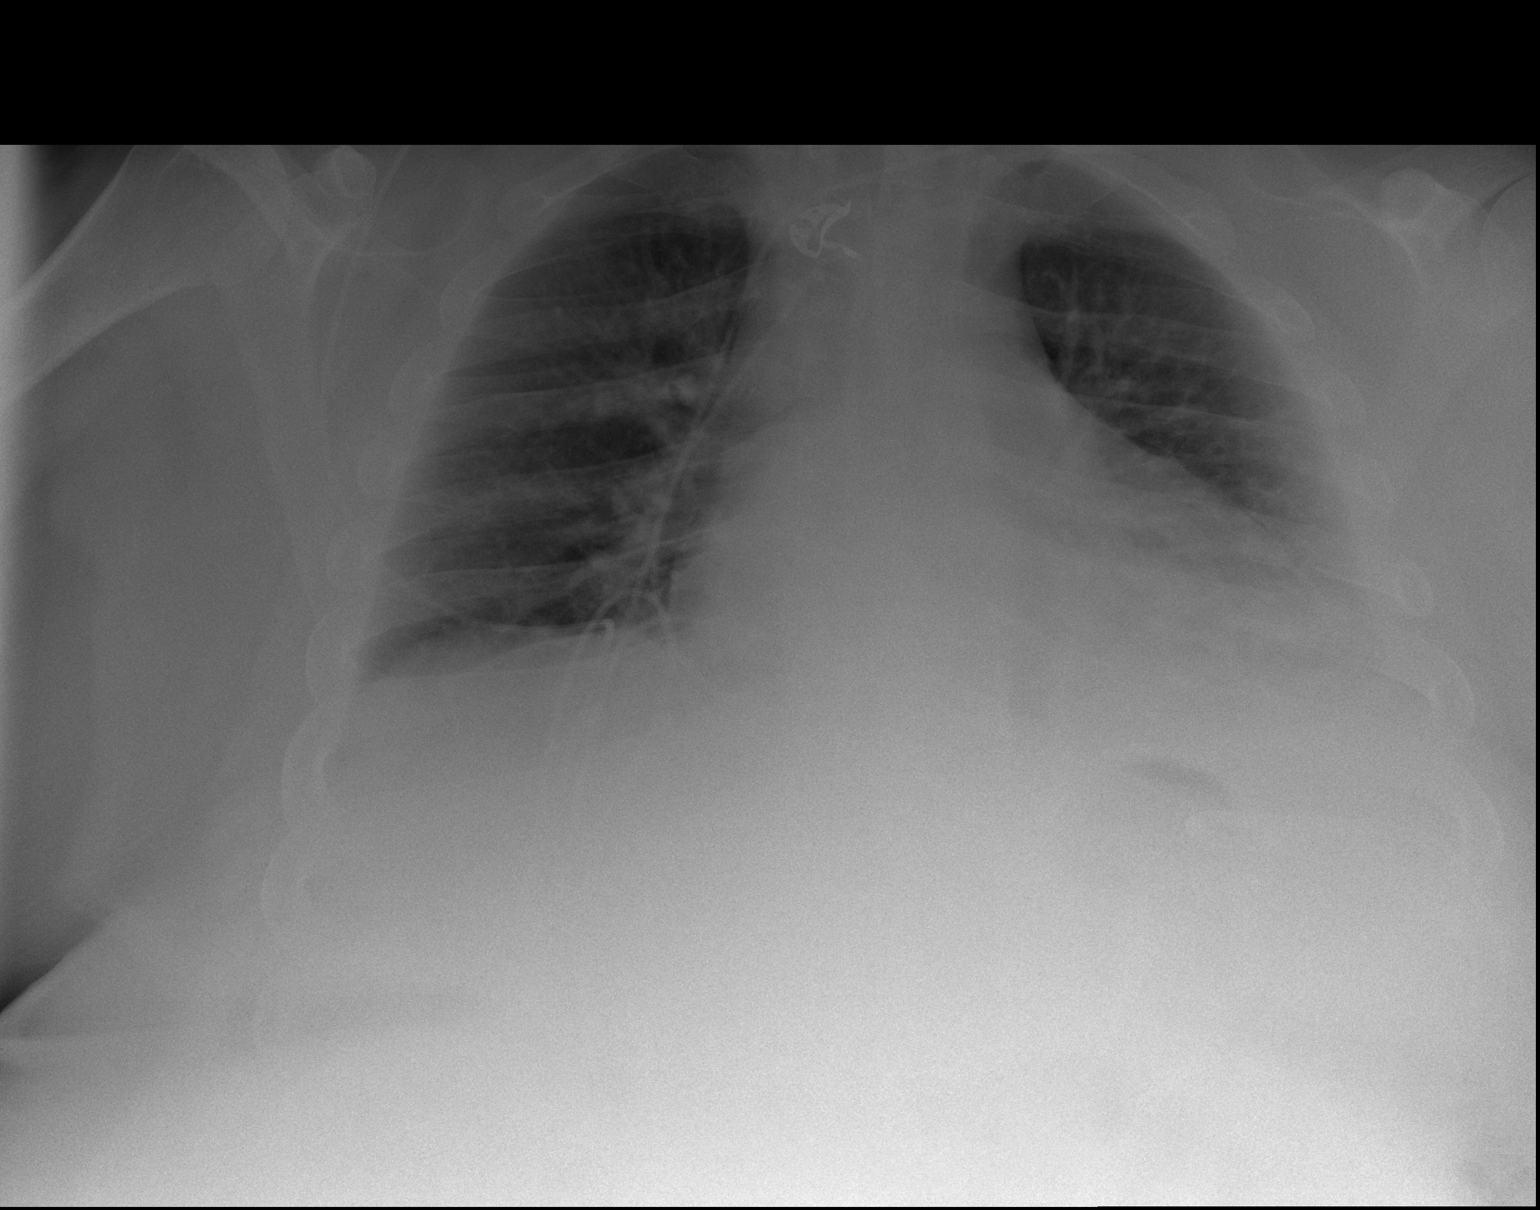

[2 of 2 positions shown; findings below may reference images not displayed]

FINDINGS: Stable chest tube positioning at the left base where there is
opacity from a fluid collection by CT. Low volumes. No visible
pneumothorax.
IMPRESSION: Unchanged pleural based opacity at the left base with superimposed
percutaneous catheter.

## 2019-04-14 IMAGING — DX DG CHEST 1V PORT
1 series · 1 of 1 positions shown · non-contrast
Comparison: February 02, 2018

CLINICAL DATA: Status post chest tube

EXAM:
PORTABLE CHEST 1 VIEW

[chest]
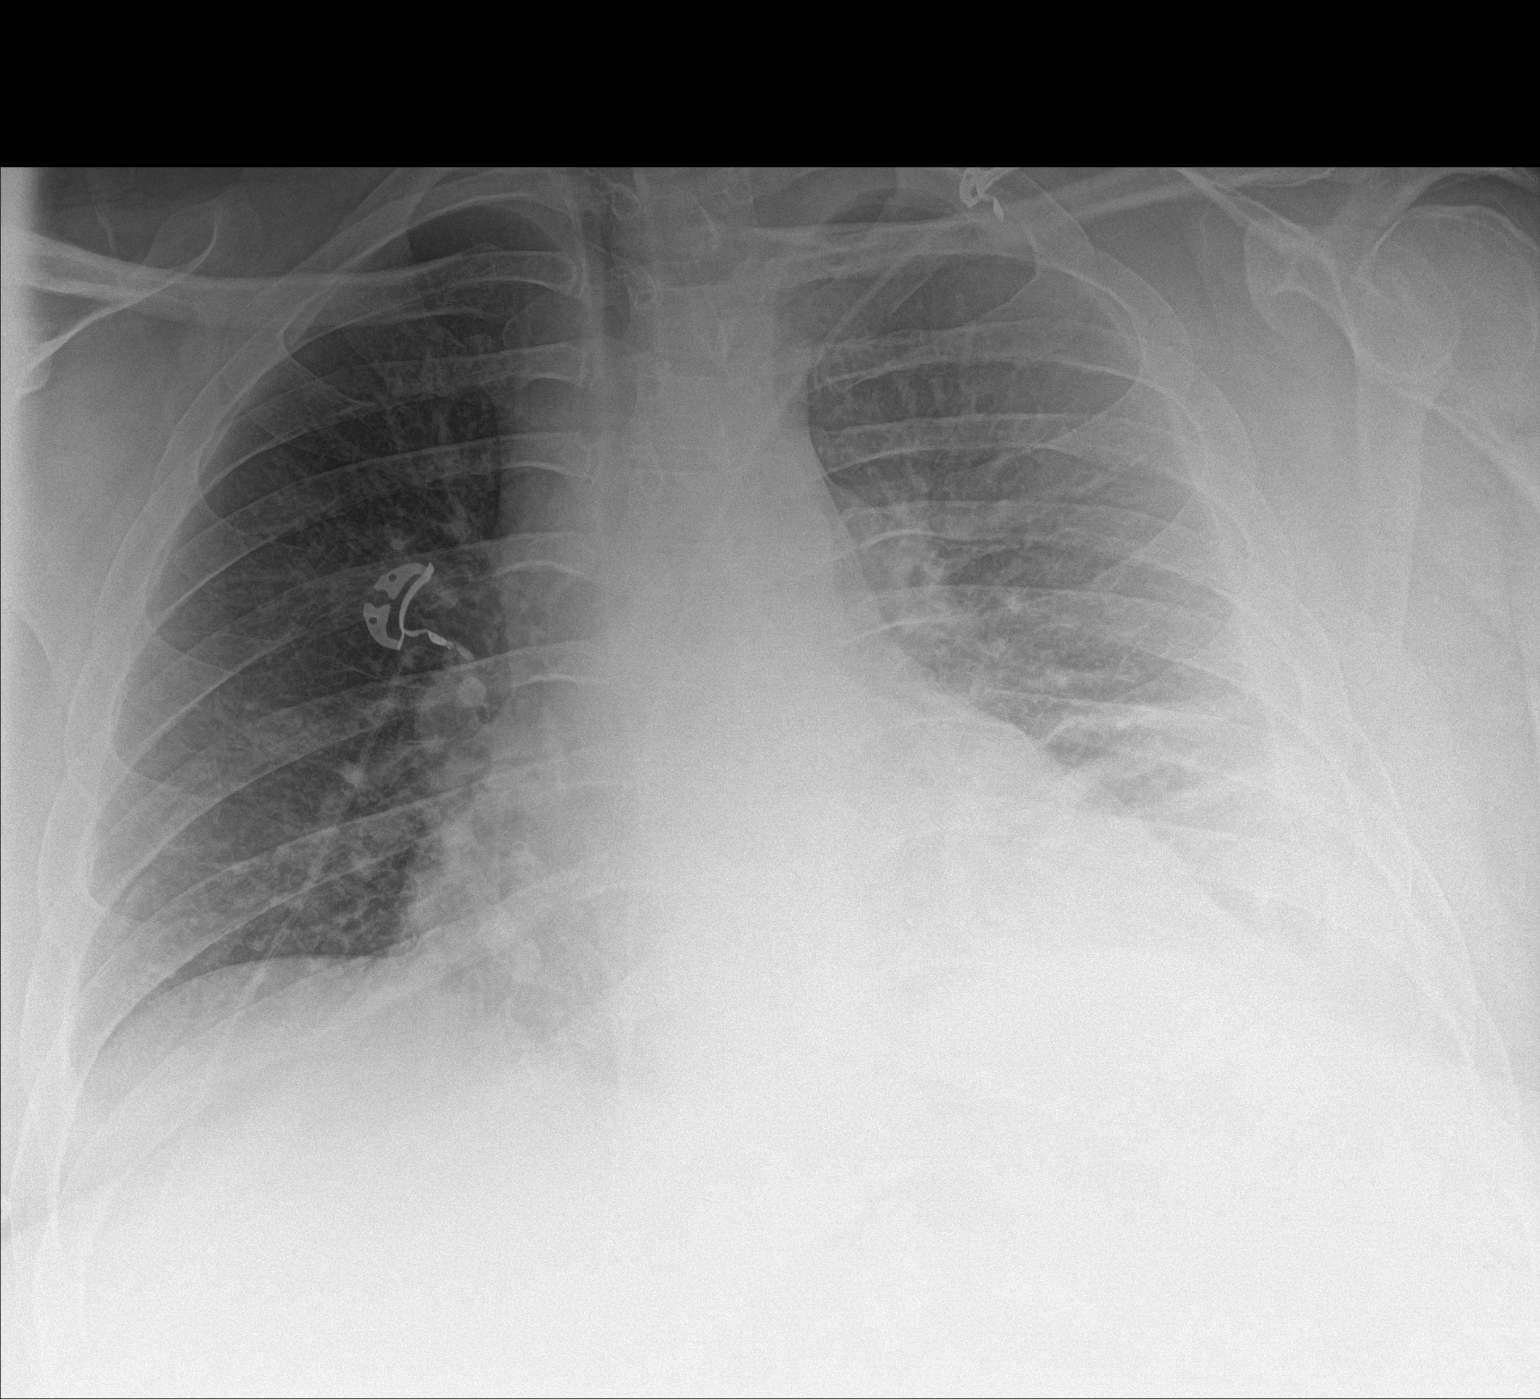

[1 of 1 positions shown; findings below may reference images not displayed]

FINDINGS: No evident pneumothorax. There is a left pleural effusion with left
base atelectatic change. Lungs elsewhere are clear. Heart is mildly
enlarged with pulmonary vascularity normal. No adenopathy. No bone
lesions.
IMPRESSION: No pneumothorax. Left pleural effusion with left base atelectasis.
Lungs elsewhere clear. Stable cardiac prominence.
